# Patient Record
Sex: Female | Born: 1970 | Race: White | Hispanic: No | Marital: Married | Smoking: Never smoker
Health system: Southern US, Community
[De-identification: ages and names within clinical notes are randomized; demographics above are authoritative.]

## PROBLEM LIST (undated history)

## (undated) DIAGNOSIS — N200 Calculus of kidney: Secondary | ICD-10-CM

## (undated) DIAGNOSIS — E039 Hypothyroidism, unspecified: Secondary | ICD-10-CM

## (undated) DIAGNOSIS — K859 Acute pancreatitis without necrosis or infection, unspecified: Secondary | ICD-10-CM

## (undated) DIAGNOSIS — Z9889 Other specified postprocedural states: Secondary | ICD-10-CM

## (undated) DIAGNOSIS — R112 Nausea with vomiting, unspecified: Secondary | ICD-10-CM

## (undated) DIAGNOSIS — K219 Gastro-esophageal reflux disease without esophagitis: Secondary | ICD-10-CM

---

## 2001-03-15 HISTORY — PX: CHOLECYSTECTOMY: SHX55

## 2006-03-15 ENCOUNTER — Encounter: Admission: RE | Admit: 2006-03-15 | Discharge: 2006-03-15 | Payer: Self-pay | Admitting: Family Medicine

## 2008-01-22 ENCOUNTER — Ambulatory Visit (HOSPITAL_COMMUNITY): Admission: RE | Admit: 2008-01-22 | Discharge: 2008-01-22 | Payer: Self-pay | Admitting: General Surgery

## 2008-02-14 ENCOUNTER — Encounter: Admission: RE | Admit: 2008-02-14 | Discharge: 2008-02-14 | Payer: Self-pay | Admitting: General Surgery

## 2008-06-15 HISTORY — PX: LAPAROSCOPIC GASTRIC BANDING: SHX1100

## 2008-06-23 ENCOUNTER — Encounter: Admission: RE | Admit: 2008-06-23 | Discharge: 2008-07-21 | Payer: Self-pay | Admitting: General Surgery

## 2008-07-07 ENCOUNTER — Ambulatory Visit (HOSPITAL_COMMUNITY): Admission: RE | Admit: 2008-07-07 | Discharge: 2008-07-07 | Payer: Self-pay | Admitting: General Surgery

## 2009-03-03 ENCOUNTER — Encounter: Admission: RE | Admit: 2009-03-03 | Discharge: 2009-03-03 | Payer: Self-pay | Admitting: Family Medicine

## 2010-01-21 ENCOUNTER — Encounter: Admission: RE | Admit: 2010-01-21 | Discharge: 2010-01-21 | Payer: Self-pay | Admitting: Family Medicine

## 2010-03-11 ENCOUNTER — Encounter: Admission: RE | Admit: 2010-03-11 | Discharge: 2010-03-11 | Payer: Self-pay | Admitting: Family Medicine

## 2010-06-04 ENCOUNTER — Encounter: Payer: Self-pay | Admitting: Family Medicine

## 2010-06-05 ENCOUNTER — Encounter: Payer: Self-pay | Admitting: Family Medicine

## 2010-08-30 LAB — DIFFERENTIAL
Basophils Absolute: 0.1 10*3/uL (ref 0.0–0.1)
Eosinophils Absolute: 0.2 10*3/uL (ref 0.0–0.7)
Eosinophils Relative: 3 % (ref 0–5)
Lymphocytes Relative: 31 % (ref 12–46)
Lymphs Abs: 2.6 10*3/uL (ref 0.7–4.0)
Neutrophils Relative %: 58 % (ref 43–77)

## 2010-08-30 LAB — COMPREHENSIVE METABOLIC PANEL
ALT: 20 U/L (ref 0–35)
AST: 21 U/L (ref 0–37)
CO2: 27 mEq/L (ref 19–32)
Calcium: 9.1 mg/dL (ref 8.4–10.5)
Chloride: 108 mEq/L (ref 96–112)
Creatinine, Ser: 0.74 mg/dL (ref 0.4–1.2)
GFR calc non Af Amer: >60 mL/min (ref >60)
Glucose, Bld: 95 mg/dL (ref 70–99)
Total Bilirubin: 1 mg/dL (ref 0.3–1.2)

## 2010-08-30 LAB — CBC
Hemoglobin: 12.4 g/dL (ref 12.0–15.0)
MCHC: 32.8 g/dL (ref 30.0–36.0)
MCV: 85.5 fL (ref 78.0–100.0)
RBC: 4.44 MIL/uL (ref 3.87–5.11)
WBC: 8.3 10*3/uL (ref 4.0–10.5)

## 2010-08-30 LAB — PREGNANCY, URINE: Preg Test, Ur: NEGATIVE

## 2010-09-27 NOTE — Op Note (Signed)
NAMECHANTAY, WHITELOCK             ACCOUNT NO.:  [id number]   MEDICAL RECORD NO.:  [id number]          PATIENT TYPE:  OIB   LOCATION:  1529                         FACILITY:  [facility]   PHYSICIAN:  Sharlet Salina T. Hoxworth, M.D.DATE OF BIRTH:  04/11/1971   DATE OF PROCEDURE:  07/07/2008  DATE OF DISCHARGE:  07/07/2008                               OPERATIVE REPORT   PREOPERATIVE DIAGNOSIS:  Morbid obesity.   POSTOPERATIVE DIAGNOSIS:  Morbid obesity.   SURGICAL PROCEDURE:  Placement of laparoscopic adjustable gastric band.   SURGEON:  Dr. Johna Sheriff.   ASSISTANT:  Dr. Luretha Murphy.   ANESTHESIA:  General.   BRIEF HISTORY:  Ms. Ikner is a 40 year old female who presents with  progressive morbid obesity unresponsive to medical management.  She  presents with a BMI of 40.  Following extensive preoperative discussion  detailed elsewhere, we have elected to proceed with placement of  laparoscopic adjustable gastric band.  She is brought to the operating  room for this procedure.   DESCRIPTION OF OPERATION:  The patient was brought to the operating  room, placed in supine position on the operating table and general  endotracheal anesthesia was induced.  She had received preoperative  subcutaneous heparin and IV antibiotics.  PAS were in place.  The  abdomen was widely sterilely prepped and draped.  The correct patient  and procedure were verified.   The trocar sites were anesthetized prior to the incisions.  Abdominal  access was obtained with a 11 mm Optiview trocar in the left subcostal  space without difficulty and pneumoperitoneum established.  There was no  evidence of trocar injury on laparoscopy.  Under direct vision, a 15 mm  trocar was placed laterally in the right upper quadrant, a 12 mm trocar  more medially in the right upper quadrant and an 11 mm trocar above and  to the left of the umbilicus for the camera port.  Through a 5-mm  subxiphoid site, the [facility] retractor was  placed and the left lobe of  the liver elevated with excellent exposure of the upper stomach and  hiatus.  The patient had a questionable small hiatal hernia on upper GI  series, though no reflux symptoms.  An Ewald tube was inserted orally  into the stomach and the balloon inflated to 10 mL and pulled back  against the hiatus and there was no evidence of hernia.  The balloon was  deflated and the tube pulled back in the mid esophagus.  The peritoneum  at the angle of His was incised over the left crus and careful blunt  dissection carried back toward the retrogastric space.  The pars  flaccida was then opened along an avascular area and the crossing fat at  the base of the right crus was identified.  The peritoneum here was  incised and the finger dissector inserted into the retrogastric space  and passed easily up to the angle of His where it was deployed through.  An AP standard flush band system was introduced and the tubing passed in  the band passer and brought back around behind the stomach without  difficulty, and the band was brought back through the retrogastric  tunnel.  With the sizing tube back in the stomach, the band was snapped  into place and there was no undue tightness.  The sizing tube was  removed.  Holding the tubing toward the patient's feet, the fundus was  imbricated up over the band to the small gastric pouch with three  interrupted 2-0 Ethibond sutures.  The band appeared to be in excellent  position.  There was no evidence of bleeding or trocar injury.  The  tubing was brought out through the right mid abdominal trocar site and  then the [facility] retractor was removed under direct vision.  All CO2  evacuated and the trocars removed.  The incision for the port was  lengthened slightly and a subcutaneous pocket created.  A square of  Prolene mesh was sutured to the back of the port which was then attached  to the tubing and placed in a subcutaneous position.  The  tubing was  introduced smoothly into the abdomen.  The subcu at this site was closed  with running 3-0 Vicryl and the skin incisions were closed with  subcuticular Monocryl and Dermabond.  Sponge, needle and instrument  counts were correct.  The patient was taken to recovery in good  condition.      Lorne Skeens. Hoxworth, M.D.  Electronically Signed     BTH/MEDQ  D:  07/07/2008  T:  07/08/2008  Job:  [id number]

## 2011-02-01 ENCOUNTER — Encounter (INDEPENDENT_AMBULATORY_CARE_PROVIDER_SITE_OTHER): Payer: Self-pay

## 2011-02-03 ENCOUNTER — Ambulatory Visit (INDEPENDENT_AMBULATORY_CARE_PROVIDER_SITE_OTHER): Payer: BC Managed Care – PPO | Admitting: Physician Assistant

## 2011-02-03 ENCOUNTER — Encounter (INDEPENDENT_AMBULATORY_CARE_PROVIDER_SITE_OTHER): Payer: Self-pay

## 2011-02-03 VITALS — BP 106/82 | HR 60 | Temp 97.4°F | Resp 16 | Ht 68.0 in | Wt 177.4 lb

## 2011-02-03 DIAGNOSIS — Z9884 Bariatric surgery status: Secondary | ICD-10-CM

## 2011-02-03 DIAGNOSIS — Z87898 Personal history of other specified conditions: Secondary | ICD-10-CM

## 2011-02-03 DIAGNOSIS — Z8639 Personal history of other endocrine, nutritional and metabolic disease: Secondary | ICD-10-CM

## 2011-02-03 NOTE — Progress Notes (Signed)
  HISTORY: Danice Dippolito is a 40 y.o.female who received an AP-Standard lap-band in February 2010 by Dr. Johna Sheriff. She has been doing well since being seen about 18 months ago. Her only issue is nocturnal reflux which is alleviated by eating before 7 pm.  VITAL SIGNS: Filed Vitals:   02/03/11 1345  BP: 106/82  Pulse: 60  Temp: 97.4 F (36.3 C)  Resp: 16    PHYSICAL EXAM: Physical exam reveals a very well-appearing 40 y.o.female in no apparent distress Neurologic: Awake, alert, oriented Psych: Bright affect, conversant Respiratory: Breathing even and unlabored. No stridor or wheezing Extremities: Atraumatic, good range of motion. Skin: Warm, Dry, no rashes Musculoskeletal: Normal gait, Joints normal  ASSESMENT: 40 y.o.  female  s/p AP-Standard lap-band.   PLAN: Continue evening meals prior to 7 pm. Omeprazole or other PPI as needed. Return as needed.

## 2011-02-03 NOTE — Patient Instructions (Signed)
Follow up as needed

## 2011-03-28 ENCOUNTER — Other Ambulatory Visit: Payer: Self-pay | Admitting: Family Medicine

## 2011-03-28 DIAGNOSIS — Z1231 Encounter for screening mammogram for malignant neoplasm of breast: Secondary | ICD-10-CM

## 2011-04-20 ENCOUNTER — Ambulatory Visit: Payer: Self-pay

## 2011-05-02 ENCOUNTER — Ambulatory Visit
Admission: RE | Admit: 2011-05-02 | Discharge: 2011-05-02 | Disposition: A | Discharge status: Home / Self Care | Payer: BC Managed Care – PPO | Source: Ambulatory Visit | Attending: Family Medicine | Admitting: Family Medicine

## 2011-05-02 DIAGNOSIS — Z1231 Encounter for screening mammogram for malignant neoplasm of breast: Secondary | ICD-10-CM

## 2012-05-02 ENCOUNTER — Other Ambulatory Visit: Payer: Self-pay | Admitting: Family Medicine

## 2012-05-02 ENCOUNTER — Encounter (INDEPENDENT_AMBULATORY_CARE_PROVIDER_SITE_OTHER): Payer: Self-pay | Admitting: Physician Assistant

## 2012-05-02 ENCOUNTER — Ambulatory Visit (INDEPENDENT_AMBULATORY_CARE_PROVIDER_SITE_OTHER): Payer: BC Managed Care – PPO | Admitting: Physician Assistant

## 2012-05-02 VITALS — BP 124/68 | HR 72 | Temp 98.3°F | Resp 16 | Ht 68.0 in | Wt 177.2 lb

## 2012-05-02 DIAGNOSIS — Z4651 Encounter for fitting and adjustment of gastric lap band: Secondary | ICD-10-CM

## 2012-05-02 DIAGNOSIS — Z1231 Encounter for screening mammogram for malignant neoplasm of breast: Secondary | ICD-10-CM

## 2012-05-02 DIAGNOSIS — K219 Gastro-esophageal reflux disease without esophagitis: Secondary | ICD-10-CM

## 2012-05-02 MED ORDER — PANTOPRAZOLE SODIUM 20 MG PO TBEC: 20.0000 mg | DELAYED_RELEASE_TABLET | Freq: Every day | ORAL | Status: DC

## 2012-05-02 NOTE — Patient Instructions (Signed)
Return in two months. Focus on good food choices as well as physical activity. Return sooner if you have an increase in hunger, portion sizes or weight. Return also for difficulty swallowing, night cough, reflux.   

## 2012-05-02 NOTE — Progress Notes (Signed)
  HISTORY: [name] is a 41 y.o.female who received an AP-Standard lap-band in February 2010 by Dr. Johna Sheriff. She was last seen in September 2012. Since then she's had GERD symptoms but they've worsened. She's also having increasing solid food dysphagia. She says she's tried OTC H2 blockers and omeprazole without much success. She believes that she needs some fluid removed.  VITAL SIGNS: Filed Vitals:   05/02/12 1554  BP: 124/68  Pulse: 72  Temp: 98.3 F (36.8 C)  Resp: 16    PHYSICAL EXAM: Physical exam reveals a very well-appearing 41 y.o.female in no apparent distress Neurologic: Awake, alert, oriented Psych: Bright affect, conversant Respiratory: Breathing even and unlabored. No stridor or wheezing Abdomen: Soft, nontender, nondistended to palpation. Incisions well-healed. No incisional hernias. Port easily palpated. Extremities: Atraumatic, good range of motion.  ASSESMENT: 41 y.o.  female  s/p AP-Standard lap-band.   PLAN: The patient's port was accessed with a 20G Huber needle without difficulty. Clear fluid was aspirated and 0.5 mL saline was removed from the port to give 5.75 mL. She was able to swallow water without difficulty following this. I've prescribed Protonix daily (a 30 day Rx and a 90 day Rx for mail order pharmacy purposes). We'll have her back in a couple months or sooner if needed.

## 2012-05-23 ENCOUNTER — Ambulatory Visit
Admission: RE | Admit: 2012-05-23 | Discharge: 2012-05-23 | Disposition: A | Discharge status: Home / Self Care | Payer: BC Managed Care – PPO | Source: Ambulatory Visit | Attending: Family Medicine | Admitting: Family Medicine

## 2012-05-23 DIAGNOSIS — Z1231 Encounter for screening mammogram for malignant neoplasm of breast: Secondary | ICD-10-CM

## 2012-07-04 ENCOUNTER — Encounter (INDEPENDENT_AMBULATORY_CARE_PROVIDER_SITE_OTHER): Payer: Self-pay

## 2012-07-04 ENCOUNTER — Ambulatory Visit (INDEPENDENT_AMBULATORY_CARE_PROVIDER_SITE_OTHER): Payer: BC Managed Care – PPO | Admitting: Physician Assistant

## 2012-07-04 VITALS — BP 108/82 | HR 80 | Temp 98.3°F | Resp 18 | Ht 68.0 in | Wt 195.6 lb

## 2012-07-04 DIAGNOSIS — Z4651 Encounter for fitting and adjustment of gastric lap band: Secondary | ICD-10-CM

## 2012-07-04 NOTE — Progress Notes (Signed)
  HISTORY: [name] is a 42 y.o.female who received an AP-Standard lap-band in February 2010 by Dr. Johna Sheriff. She comes in with 18 lbs weight gain since removal of 0.5 mL fluid 2 months ago for GERD. Her reflux symptoms have completely resolved but her hunger is significant. She would like a fill today to get her weight gain under better control.  VITAL SIGNS: Filed Vitals:   07/04/12 1035  BP: 108/82  Pulse: 80  Temp: 98.3 F (36.8 C)  Resp: 18    PHYSICAL EXAM: Physical exam reveals a very well-appearing 42 y.o.female in no apparent distress Neurologic: Awake, alert, oriented Psych: Bright affect, conversant Respiratory: Breathing even and unlabored. No stridor or wheezing Abdomen: Soft, nontender, nondistended to palpation. Incisions well-healed. No incisional hernias. Port easily palpated. Extremities: Atraumatic, good range of motion.  ASSESMENT: 42 y.o.  female  s/p AP-Standard lap-band.   PLAN: The patient's port was accessed with a 20G Huber needle without difficulty. Clear fluid was aspirated and 0.25 mL saline was added to the port to give a total predicted volume of 6 mL. The patient was able to swallow water without difficulty following the procedure and was instructed to take clear liquids for the next 24-48 hours and advance slowly as tolerated.

## 2012-07-04 NOTE — Patient Instructions (Signed)
Take clear liquids tonight. Thin protein shakes are ok to start tomorrow morning. Slowly advance your diet thereafter. Call us if you have persistent vomiting or regurgitation, night cough or reflux symptoms. Return as scheduled or sooner if you notice no changes in hunger/portion sizes.  

## 2012-10-24 ENCOUNTER — Ambulatory Visit (INDEPENDENT_AMBULATORY_CARE_PROVIDER_SITE_OTHER): Payer: BC Managed Care – PPO | Admitting: Physician Assistant

## 2012-10-24 ENCOUNTER — Encounter (INDEPENDENT_AMBULATORY_CARE_PROVIDER_SITE_OTHER): Payer: Self-pay

## 2012-10-24 VITALS — BP 122/80 | HR 78 | Temp 97.8°F | Resp 14 | Ht 68.0 in | Wt 197.4 lb

## 2012-10-24 DIAGNOSIS — Z4651 Encounter for fitting and adjustment of gastric lap band: Secondary | ICD-10-CM

## 2012-10-24 NOTE — Patient Instructions (Signed)
Take clear liquids tonight. Thin protein shakes are ok to start tomorrow morning. Slowly advance your diet thereafter. Call us if you have persistent vomiting or regurgitation, night cough or reflux symptoms. Return as scheduled or sooner if you notice no changes in hunger/portion sizes.  

## 2012-10-24 NOTE — Progress Notes (Signed)
  HISTORY: [name] is a 42 y.o.female who received an AP-Standard lap-band in February 2010 by Dr. Johna Sheriff. She comes in with little change in weight since her last visit but she does complain of gradually increasing hunger and portion sizes. She denies regurgitation or reflux. She would like a fill today to help keep her hunger under control as well as to reduce her weight.  VITAL SIGNS: Filed Vitals:   10/24/12 0910  BP: 122/80  Pulse: 78  Temp: 97.8 F (36.6 C)  Resp: 14    PHYSICAL EXAM: Physical exam reveals a very well-appearing 42 y.o.female in no apparent distress Neurologic: Awake, alert, oriented Psych: Bright affect, conversant Respiratory: Breathing even and unlabored. No stridor or wheezing Abdomen: Soft, nontender, nondistended to palpation. Incisions well-healed. No incisional hernias. Port easily palpated. Extremities: Atraumatic, good range of motion.  ASSESMENT: 42 y.o.  female  s/p AP-Standard lap-band.   PLAN: The patient's port was accessed with a 20G Huber needle without difficulty. Clear fluid was aspirated and 0.25 mL saline was added to the port to give a total predicted volume of 6.25 mL. The patient was able to swallow water without difficulty following the procedure and was instructed to take clear liquids for the next 24-48 hours and advance slowly as tolerated. This is her highest fill volume thus far. She had this volume in the past but it required removal eventually due to GERD. Hopefully this will give her the appropriate amount of restriction without dysphagia or reflux.

## 2013-01-28 ENCOUNTER — Other Ambulatory Visit: Payer: Self-pay | Admitting: Orthopedic Surgery

## 2013-02-05 ENCOUNTER — Encounter (HOSPITAL_COMMUNITY): Payer: Self-pay | Admitting: Pharmacy Technician

## 2013-02-06 ENCOUNTER — Encounter (HOSPITAL_COMMUNITY)
Admission: RE | Admit: 2013-02-06 | Discharge: 2013-02-06 | Disposition: A | Discharge status: Home / Self Care | Payer: BC Managed Care – PPO | Source: Ambulatory Visit | Attending: Orthopedic Surgery | Admitting: Orthopedic Surgery

## 2013-02-06 ENCOUNTER — Encounter (HOSPITAL_COMMUNITY): Payer: Self-pay

## 2013-02-06 DIAGNOSIS — Z01818 Encounter for other preprocedural examination: Secondary | ICD-10-CM | POA: Insufficient documentation

## 2013-02-06 DIAGNOSIS — Z01812 Encounter for preprocedural laboratory examination: Secondary | ICD-10-CM | POA: Insufficient documentation

## 2013-02-06 DIAGNOSIS — Z01810 Encounter for preprocedural cardiovascular examination: Secondary | ICD-10-CM | POA: Insufficient documentation

## 2013-02-06 HISTORY — DX: Other specified postprocedural states: R11.2

## 2013-02-06 HISTORY — DX: Calculus of kidney: N20.0

## 2013-02-06 HISTORY — DX: Other specified postprocedural states: Z98.890

## 2013-02-06 HISTORY — DX: Acute pancreatitis without necrosis or infection, unspecified: K85.90

## 2013-02-06 HISTORY — DX: Gastro-esophageal reflux disease without esophagitis: K21.9

## 2013-02-06 HISTORY — DX: Hypothyroidism, unspecified: E03.9

## 2013-02-06 LAB — ABO/RH: ABO/RH(D): O POS

## 2013-02-06 LAB — TYPE AND SCREEN: Antibody Screen: NEGATIVE

## 2013-02-06 LAB — URINALYSIS, ROUTINE W REFLEX MICROSCOPIC
Hgb urine dipstick: NEGATIVE
Nitrite: NEGATIVE
Protein, ur: NEGATIVE mg/dL
Specific Gravity, Urine: 1.018 (ref 1.005–1.030)
Urobilinogen, UA: 0.2 mg/dL (ref 0.0–1.0)
pH: 6 (ref 5.0–8.0)

## 2013-02-06 LAB — CBC WITH DIFFERENTIAL/PLATELET
Basophils Absolute: 0.1 10*3/uL (ref 0.0–0.1)
Eosinophils Absolute: 0.2 10*3/uL (ref 0.0–0.7)
HCT: 35 % — ABNORMAL LOW (ref 36.0–46.0)
Hemoglobin: 11.8 g/dL — ABNORMAL LOW (ref 12.0–15.0)
Lymphocytes Relative: 48 % — ABNORMAL HIGH (ref 12–46)
Lymphs Abs: 3 10*3/uL (ref 0.7–4.0)
MCHC: 33.7 g/dL (ref 30.0–36.0)
MCV: 86.4 fL (ref 78.0–100.0)
Neutro Abs: 2.6 10*3/uL (ref 1.7–7.7)
WBC: 6.2 10*3/uL (ref 4.0–10.5)

## 2013-02-06 LAB — COMPREHENSIVE METABOLIC PANEL
ALT: 9 U/L (ref 0–35)
AST: 18 U/L (ref 0–37)
Alkaline Phosphatase: 76 U/L (ref 39–117)
GFR calc Af Amer: >90 mL/min (ref >90)
Glucose, Bld: 83 mg/dL (ref 70–99)
Potassium: 4.1 mEq/L (ref 3.5–5.1)
Sodium: 138 mEq/L (ref 135–145)
Total Bilirubin: 0.5 mg/dL (ref 0.3–1.2)
Total Protein: 6.9 g/dL (ref 6.0–8.3)

## 2013-02-06 LAB — APTT: aPTT: 34 seconds (ref 24–37)

## 2013-02-06 LAB — SURGICAL PCR SCREEN
MRSA, PCR: NEGATIVE
Staphylococcus aureus: NEGATIVE

## 2013-02-06 LAB — URINE MICROSCOPIC-ADD ON

## 2013-02-06 NOTE — Progress Notes (Signed)
PCP is Dr. Delorse Lek  Denies seeing a Cardiologist.  Denies having a recent EKG, CXR  Denies having a stress test, Echo, or card cath.

## 2013-02-06 NOTE — Pre-Procedure Instructions (Signed)
Devyne Hauger  02/06/2013   Your procedure is scheduled on:  Oct 1 @ 0830  Report to [facility]  2 * 3 at [phone].  Call this number if you have problems the morning of surgery: [phone]   Remember:   Do not eat food or drink liquids after midnight.   Take these medicines the morning of surgery with A SIP OF WATER: Hydrocodone-acetaminophen if needed  Stop taking Aspirin, Aleve, Ibuprofen, BC'S, Goody's, Fish Oil, and Herbal medications.   Do not wear jewelry, make-up or nail polish.  Do not wear lotions, powders, or perfumes. You may wear deodorant.  Do not shave 48 hours prior to surgery. Men may shave face and neck.  Do not bring valuables to the hospital.  [facility] is not responsible                  for any belongings or valuables.               Contacts, dentures or bridgework may not be worn into surgery.  Leave suitcase in the car. After surgery it may be brought to your room.  For patients admitted to the hospital, discharge time is determined by your                treatment team.               Patients discharged the day of surgery will not be allowed to drive  home.    Special Instructions: Shower using CHG 2 nights before surgery and the night before surgery.  If you shower the day of surgery use CHG.  Use special wash - you have one bottle of CHG for all showers.  You should use approximately 1/3 of the bottle for each shower.   Please read over the following fact sheets that you were given: Pain Booklet, Coughing and Deep Breathing, Blood Transfusion Information, MRSA Information and Surgical Site Infection Prevention

## 2013-02-08 LAB — URINE CULTURE

## 2013-02-11 MED ORDER — CEFAZOLIN SODIUM-DEXTROSE 2-3 GM-% IV SOLR
2.0000 g | INTRAVENOUS | Status: AC
  Administered 2013-02-12: 2 g via INTRAVENOUS
  Filled 2013-02-11: qty 50

## 2013-02-12 ENCOUNTER — Ambulatory Visit (HOSPITAL_COMMUNITY): Payer: BC Managed Care – PPO | Admitting: Anesthesiology

## 2013-02-12 ENCOUNTER — Encounter (HOSPITAL_COMMUNITY): Payer: Self-pay | Admitting: Anesthesiology

## 2013-02-12 ENCOUNTER — Encounter (HOSPITAL_COMMUNITY)
Admission: RE | Disposition: A | Discharge status: Home / Self Care | Payer: Self-pay | Source: Ambulatory Visit | Attending: Orthopedic Surgery

## 2013-02-12 ENCOUNTER — Inpatient Hospital Stay (HOSPITAL_COMMUNITY)
Admission: RE | Admit: 2013-02-12 | Discharge: 2013-02-13 | DRG: 865 | Disposition: A | Discharge status: Home / Self Care | Payer: BC Managed Care – PPO | Source: Ambulatory Visit | Attending: Orthopedic Surgery | Admitting: Orthopedic Surgery

## 2013-02-12 ENCOUNTER — Ambulatory Visit (HOSPITAL_COMMUNITY): Payer: BC Managed Care – PPO

## 2013-02-12 DIAGNOSIS — M5020 Other cervical disc displacement, unspecified cervical region: Principal | ICD-10-CM | POA: Diagnosis present

## 2013-02-12 DIAGNOSIS — K219 Gastro-esophageal reflux disease without esophagitis: Secondary | ICD-10-CM | POA: Diagnosis present

## 2013-02-12 DIAGNOSIS — E039 Hypothyroidism, unspecified: Secondary | ICD-10-CM | POA: Diagnosis present

## 2013-02-12 DIAGNOSIS — Z9089 Acquired absence of other organs: Secondary | ICD-10-CM

## 2013-02-12 DIAGNOSIS — M5010 Cervical disc disorder with radiculopathy, unspecified cervical region: Secondary | ICD-10-CM

## 2013-02-12 DIAGNOSIS — Z881 Allergy status to other antibiotic agents status: Secondary | ICD-10-CM

## 2013-02-12 HISTORY — PX: ANTERIOR CERVICAL DECOMP/DISCECTOMY FUSION: SHX1161

## 2013-02-12 SURGERY — ANTERIOR CERVICAL DECOMPRESSION/DISCECTOMY FUSION 1 LEVEL
Anesthesia: General | Site: Neck | Wound class: Clean

## 2013-02-12 MED ORDER — METOCLOPRAMIDE HCL 5 MG/ML IJ SOLN
10.0000 mg | Freq: Once | INTRAMUSCULAR | Status: AC | PRN
  Administered 2013-02-12: 10 mg via INTRAVENOUS

## 2013-02-12 MED ORDER — 0.9 % SODIUM CHLORIDE (POUR BTL) OPTIME
TOPICAL | Status: DC | PRN
  Administered 2013-02-12: 1000 mL

## 2013-02-12 MED ORDER — ONDANSETRON HCL 4 MG/2ML IJ SOLN
4.0000 mg | Freq: Once | INTRAMUSCULAR | Status: AC
  Administered 2013-02-12: 4 mg via INTRAVENOUS

## 2013-02-12 MED ORDER — METOCLOPRAMIDE HCL 5 MG/ML IJ SOLN
INTRAMUSCULAR | Status: AC
  Filled 2013-02-12: qty 2

## 2013-02-12 MED ORDER — PHENOL 1.4 % MT LIQD
1.0000 | OROMUCOSAL | Status: DC | PRN
  Filled 2013-02-12: qty 177

## 2013-02-12 MED ORDER — ONDANSETRON HCL 4 MG/2ML IJ SOLN: 4.0000 mg | INTRAMUSCULAR | Status: DC | PRN

## 2013-02-12 MED ORDER — SUFENTANIL CITRATE 50 MCG/ML IV SOLN
INTRAVENOUS | Status: DC | PRN
  Administered 2013-02-12 (×3): 10 ug via INTRAVENOUS

## 2013-02-12 MED ORDER — ROCURONIUM BROMIDE 100 MG/10ML IV SOLN
INTRAVENOUS | Status: DC | PRN
  Administered 2013-02-12: 50 mg via INTRAVENOUS

## 2013-02-12 MED ORDER — THROMBIN 20000 UNITS EX SOLR
CUTANEOUS | Status: AC
  Filled 2013-02-12: qty 20000

## 2013-02-12 MED ORDER — MENTHOL 3 MG MT LOZG
LOZENGE | OROMUCOSAL | Status: AC
  Administered 2013-02-12: 1 via ORAL
  Filled 2013-02-12: qty 9

## 2013-02-12 MED ORDER — BUPIVACAINE-EPINEPHRINE 0.25% -1:200000 IJ SOLN
INTRAMUSCULAR | Status: DC | PRN
  Administered 2013-02-12: 2 mL

## 2013-02-12 MED ORDER — PROPOFOL 10 MG/ML IV BOLUS
INTRAVENOUS | Status: DC | PRN
  Administered 2013-02-12: 200 mg via INTRAVENOUS

## 2013-02-12 MED ORDER — DEXAMETHASONE SODIUM PHOSPHATE 10 MG/ML IJ SOLN
INTRAMUSCULAR | Status: DC | PRN
  Administered 2013-02-12: 8 mg via INTRAVENOUS

## 2013-02-12 MED ORDER — ZOLPIDEM TARTRATE 5 MG PO TABS: 5.0000 mg | ORAL_TABLET | Freq: Every evening | ORAL | Status: DC | PRN

## 2013-02-12 MED ORDER — HYDROMORPHONE HCL PF 1 MG/ML IJ SOLN: 0.2500 mg | INTRAMUSCULAR | Status: DC | PRN

## 2013-02-12 MED ORDER — ACETAMINOPHEN 650 MG RE SUPP: 650.0000 mg | RECTAL | Status: DC | PRN

## 2013-02-12 MED ORDER — THROMBIN 20000 UNITS EX SOLR
CUTANEOUS | Status: DC | PRN
  Administered 2013-02-12: 09:00:00 via TOPICAL

## 2013-02-12 MED ORDER — HYDROCODONE-ACETAMINOPHEN 5-325 MG PO TABS
1.0000 | ORAL_TABLET | ORAL | Status: DC | PRN
  Administered 2013-02-12: 2 via ORAL
  Filled 2013-02-12: qty 2

## 2013-02-12 MED ORDER — SODIUM CHLORIDE 0.9 % IJ SOLN
3.0000 mL | Freq: Two times a day (BID) | INTRAMUSCULAR | Status: DC
  Administered 2013-02-12: 3 mL via INTRAVENOUS

## 2013-02-12 MED ORDER — CEFAZOLIN SODIUM 1-5 GM-% IV SOLN
1.0000 g | Freq: Three times a day (TID) | INTRAVENOUS | Status: AC
Start: 2013-02-12 — End: 2013-02-12
  Administered 2013-02-12 (×2): 1 g via INTRAVENOUS
  Filled 2013-02-12 (×2): qty 50

## 2013-02-12 MED ORDER — SODIUM CHLORIDE 0.9 % IV SOLN: 250.0000 mL | INTRAVENOUS | Status: DC

## 2013-02-12 MED ORDER — LIDOCAINE HCL (CARDIAC) 20 MG/ML IV SOLN
INTRAVENOUS | Status: DC | PRN
  Administered 2013-02-12: 100 mg via INTRAVENOUS

## 2013-02-12 MED ORDER — GLYCOPYRROLATE 0.2 MG/ML IJ SOLN
INTRAMUSCULAR | Status: DC | PRN
  Administered 2013-02-12: 0.4 mg via INTRAVENOUS

## 2013-02-12 MED ORDER — DIAZEPAM 5 MG PO TABS
5.0000 mg | ORAL_TABLET | Freq: Four times a day (QID) | ORAL | Status: DC | PRN
  Administered 2013-02-12: 5 mg via ORAL
  Filled 2013-02-12: qty 1

## 2013-02-12 MED ORDER — SODIUM CHLORIDE 0.9 % IJ SOLN: 3.0000 mL | INTRAMUSCULAR | Status: DC | PRN

## 2013-02-12 MED ORDER — ACETAMINOPHEN 325 MG PO TABS: 650.0000 mg | ORAL_TABLET | ORAL | Status: DC | PRN

## 2013-02-12 MED ORDER — BUPIVACAINE-EPINEPHRINE PF 0.25-1:200000 % IJ SOLN
INTRAMUSCULAR | Status: AC
  Filled 2013-02-12: qty 30

## 2013-02-12 MED ORDER — MIDAZOLAM HCL 5 MG/5ML IJ SOLN
INTRAMUSCULAR | Status: DC | PRN
  Administered 2013-02-12: 2 mg via INTRAVENOUS

## 2013-02-12 MED ORDER — NEOSTIGMINE METHYLSULFATE 1 MG/ML IJ SOLN
INTRAMUSCULAR | Status: DC | PRN
  Administered 2013-02-12: 3 mg via INTRAVENOUS

## 2013-02-12 MED ORDER — MORPHINE SULFATE 2 MG/ML IJ SOLN: 1.0000 mg | INTRAMUSCULAR | Status: DC | PRN

## 2013-02-12 MED ORDER — PHENYLEPHRINE HCL 10 MG/ML IJ SOLN
10.0000 mg | INTRAVENOUS | Status: DC | PRN
  Administered 2013-02-12: 20 ug/min via INTRAVENOUS

## 2013-02-12 MED ORDER — ONDANSETRON HCL 4 MG/2ML IJ SOLN
INTRAMUSCULAR | Status: DC | PRN
  Administered 2013-02-12: 4 mg via INTRAVENOUS

## 2013-02-12 MED ORDER — OXYCODONE HCL 5 MG/5ML PO SOLN: 5.0000 mg | Freq: Once | ORAL | Status: DC | PRN

## 2013-02-12 MED ORDER — ALUM & MAG HYDROXIDE-SIMETH 200-200-20 MG/5ML PO SUSP: 30.0000 mL | Freq: Four times a day (QID) | ORAL | Status: DC | PRN

## 2013-02-12 MED ORDER — MENTHOL 3 MG MT LOZG
1.0000 | LOZENGE | OROMUCOSAL | Status: DC | PRN
  Administered 2013-02-12: 1 via ORAL
  Filled 2013-02-12: qty 9

## 2013-02-12 MED ORDER — VECURONIUM BROMIDE 10 MG IV SOLR
INTRAVENOUS | Status: DC | PRN
  Administered 2013-02-12: 2 mg via INTRAVENOUS

## 2013-02-12 MED ORDER — OXYCODONE HCL 5 MG PO TABS: 5.0000 mg | ORAL_TABLET | Freq: Once | ORAL | Status: DC | PRN

## 2013-02-12 MED ORDER — METOCLOPRAMIDE HCL 5 MG/ML IJ SOLN
INTRAMUSCULAR | Status: DC | PRN
  Administered 2013-02-12: 10 mg via INTRAVENOUS

## 2013-02-12 MED ORDER — POVIDONE-IODINE 7.5 % EX SOLN
Freq: Once | CUTANEOUS | Status: DC
  Filled 2013-02-12: qty 118

## 2013-02-12 MED ORDER — ONDANSETRON HCL 4 MG/2ML IJ SOLN
INTRAMUSCULAR | Status: AC
  Administered 2013-02-12: 4 mg via INTRAVENOUS
  Filled 2013-02-12: qty 2

## 2013-02-12 MED ORDER — PHENYLEPHRINE HCL 10 MG/ML IJ SOLN
INTRAMUSCULAR | Status: DC | PRN
  Administered 2013-02-12 (×6): 80 ug via INTRAVENOUS
  Administered 2013-02-12: 120 ug via INTRAVENOUS
  Administered 2013-02-12: 80 ug via INTRAVENOUS

## 2013-02-12 MED ORDER — OXYCODONE-ACETAMINOPHEN 5-325 MG PO TABS: 1.0000 | ORAL_TABLET | ORAL | Status: DC | PRN

## 2013-02-12 MED ORDER — LACTATED RINGERS IV SOLN
INTRAVENOUS | Status: DC | PRN
  Administered 2013-02-12 (×2): via INTRAVENOUS

## 2013-02-12 SURGICAL SUPPLY — 77 items
APL SKNCLS STERI-STRIP NONHPOA (GAUZE/BANDAGES/DRESSINGS) ×1
BENZOIN TINCTURE PRP APPL 2/3 (GAUZE/BANDAGES/DRESSINGS) ×2 IMPLANT
BIT DRILL NEURO 2X3.1 SFT TUCH (MISCELLANEOUS) ×1 IMPLANT
BIT DRILL SRG 14X2.2XFLT CHK (BIT) IMPLANT
BIT DRL SRG 14X2.2XFLT CHK (BIT) ×1
BLADE SURG 15 STRL LF DISP TIS (BLADE) ×1 IMPLANT
BLADE SURG 15 STRL SS (BLADE) ×2
BLADE SURG ROTATE 9660 (MISCELLANEOUS) ×1 IMPLANT
BUR MATCHSTICK NEURO 3.0 LAGG (BURR) IMPLANT
CARTRIDGE OIL MAESTRO DRILL (MISCELLANEOUS) ×1 IMPLANT
CLOTH BEACON ORANGE TIMEOUT ST (SAFETY) ×2 IMPLANT
CLSR STERI-STRIP ANTIMIC 1/2X4 (GAUZE/BANDAGES/DRESSINGS) ×1 IMPLANT
CORDS BIPOLAR (ELECTRODE) ×2 IMPLANT
COVER SURGICAL LIGHT HANDLE (MISCELLANEOUS) ×2 IMPLANT
CRADLE DONUT ADULT HEAD (MISCELLANEOUS) ×2 IMPLANT
DEVICE ENDSKLTN MED 6 7MM (Orthopedic Implant) IMPLANT
DIFFUSER DRILL AIR PNEUMATIC (MISCELLANEOUS) ×2 IMPLANT
DRAIN JACKSON RD 7FR 3/32 (WOUND CARE) IMPLANT
DRAPE C-ARM 42X72 X-RAY (DRAPES) ×2 IMPLANT
DRAPE POUCH INSTRU U-SHP 10X18 (DRAPES) ×2 IMPLANT
DRAPE SURG 17X23 STRL (DRAPES) ×6 IMPLANT
DRILL BIT SKYLINE 14MM (BIT) ×2
DRILL NEURO 2X3.1 SOFT TOUCH (MISCELLANEOUS) ×2
DURAPREP 26ML APPLICATOR (WOUND CARE) ×2 IMPLANT
ELECT COATED BLADE 2.86 ST (ELECTRODE) ×2 IMPLANT
ELECT REM PT RETURN 9FT ADLT (ELECTROSURGICAL) ×2
ELECTRODE REM PT RTRN 9FT ADLT (ELECTROSURGICAL) ×1 IMPLANT
ENDOSKELETON MED 6 7MM (Orthopedic Implant) ×2 IMPLANT
EVACUATOR SILICONE 100CC (DRAIN) IMPLANT
GAUZE SPONGE 4X4 16PLY XRAY LF (GAUZE/BANDAGES/DRESSINGS) ×2 IMPLANT
GLOVE BIO SURGEON STRL SZ7 (GLOVE) ×2 IMPLANT
GLOVE BIO SURGEON STRL SZ8 (GLOVE) ×2 IMPLANT
GLOVE BIOGEL PI IND STRL 7.0 (GLOVE) ×2 IMPLANT
GLOVE BIOGEL PI IND STRL 8 (GLOVE) ×1 IMPLANT
GLOVE BIOGEL PI INDICATOR 7.0 (GLOVE) ×2
GLOVE BIOGEL PI INDICATOR 8 (GLOVE) ×1
GLOVE BIOGEL PI ORTHO PRO 7.5 (GLOVE) ×1
GLOVE PI ORTHO PRO STRL 7.5 (GLOVE) IMPLANT
GLOVE SURG SS PI 7.5 STRL IVOR (GLOVE) ×1 IMPLANT
GOWN SRG XL XLNG 56XLVL 4 (GOWN DISPOSABLE) ×1 IMPLANT
GOWN STRL NON-REIN LRG LVL3 (GOWN DISPOSABLE) ×2 IMPLANT
GOWN STRL NON-REIN XL XLG LVL4 (GOWN DISPOSABLE) ×4
IV CATH 14GX2 1/4 (CATHETERS) ×2 IMPLANT
KIT BASIN OR (CUSTOM PROCEDURE TRAY) ×2 IMPLANT
KIT ROOM TURNOVER OR (KITS) ×2 IMPLANT
MANIFOLD NEPTUNE II (INSTRUMENTS) ×2 IMPLANT
NDL SPNL 20GX3.5 QUINCKE YW (NEEDLE) ×1 IMPLANT
NEEDLE 27GAX1X1/2 (NEEDLE) ×2 IMPLANT
NEEDLE SPNL 20GX3.5 QUINCKE YW (NEEDLE) ×2 IMPLANT
NS IRRIG 1000ML POUR BTL (IV SOLUTION) ×2 IMPLANT
OIL CARTRIDGE MAESTRO DRILL (MISCELLANEOUS) ×2
PACK ORTHO CERVICAL (CUSTOM PROCEDURE TRAY) ×2 IMPLANT
PAD ARMBOARD 7.5X6 YLW CONV (MISCELLANEOUS) ×4 IMPLANT
PATTIES SURGICAL .5 X.5 (GAUZE/BANDAGES/DRESSINGS) IMPLANT
PATTIES SURGICAL .5 X1 (DISPOSABLE) ×2 IMPLANT
PIN DISTRACTION 14 (PIN) ×1 IMPLANT
PLATE SKYLINE 12MM (Plate) ×1 IMPLANT
PUTTY BONE DBX 2.5 MIS (Bone Implant) ×1 IMPLANT
SCREW VAR SELF TAP SKYLINE 14M (Screw) ×4 IMPLANT
SPONGE GAUZE 4X4 12PLY (GAUZE/BANDAGES/DRESSINGS) ×2 IMPLANT
SPONGE INTESTINAL PEANUT (DISPOSABLE) ×2 IMPLANT
SPONGE SURGIFOAM ABS GEL 100 (HEMOSTASIS) ×1 IMPLANT
STRIP CLOSURE SKIN 1/2X4 (GAUZE/BANDAGES/DRESSINGS) ×2 IMPLANT
SURGIFLO TRUKIT (HEMOSTASIS) IMPLANT
SUT MNCRL AB 4-0 PS2 18 (SUTURE) ×1 IMPLANT
SUT SILK 4 0 (SUTURE) ×2
SUT SILK 4-0 18XBRD TIE 12 (SUTURE) IMPLANT
SUT VIC AB 2-0 CT2 18 VCP726D (SUTURE) ×2 IMPLANT
SYR BULB IRRIGATION 50ML (SYRINGE) ×2 IMPLANT
SYR CONTROL 10ML LL (SYRINGE) ×4 IMPLANT
TAPE CLOTH 4X10 WHT NS (GAUZE/BANDAGES/DRESSINGS) ×1 IMPLANT
TAPE CLOTH SURG 4X10 WHT LF (GAUZE/BANDAGES/DRESSINGS) ×1 IMPLANT
TAPE UMBILICAL COTTON 1/8X30 (MISCELLANEOUS) ×2 IMPLANT
TOWEL OR 17X24 6PK STRL BLUE (TOWEL DISPOSABLE) ×2 IMPLANT
TOWEL OR 17X26 10 PK STRL BLUE (TOWEL DISPOSABLE) ×2 IMPLANT
WATER STERILE IRR 1000ML POUR (IV SOLUTION) IMPLANT
YANKAUER SUCT BULB TIP NO VENT (SUCTIONS) ×2 IMPLANT

## 2013-02-12 NOTE — Transfer of Care (Signed)
Immediate Anesthesia Transfer of Care Note  Patient: [name]  Procedure(s) Performed: Procedure(s) with comments: ANTERIOR CERVICAL DECOMPRESSION/DISCECTOMY FUSION 1 LEVEL (N/A) - Anterior cervical decompression and fusion, cervical 6-7 with instrumentation and allograft.   Patient Location: PACU  Anesthesia Type:General  Level of Consciousness: awake, alert , oriented and patient cooperative  Airway & Oxygen Therapy: Patient Spontanous Breathing and Patient connected to nasal cannula oxygen  Post-op Assessment: Report given to PACU RN and Post -op Vital signs reviewed and stable  Post vital signs: Reviewed  Complications: No apparent anesthesia complications

## 2013-02-12 NOTE — Anesthesia Preprocedure Evaluation (Signed)
Anesthesia Evaluation  Patient identified by MRN, date of birth, ID band Patient awake    Reviewed: Allergy & Precautions, H&P , NPO status , Patient's Chart, lab work & pertinent test results, reviewed documented beta blocker date and time   History of Anesthesia Complications (+) PONV  Airway Mallampati: II TM Distance: >3 FB Neck ROM: full    Dental   Pulmonary neg pulmonary ROS,  breath sounds clear to auscultation        Cardiovascular negative cardio ROS  Rhythm:regular     Neuro/Psych negative neurological ROS  negative psych ROS   GI/Hepatic Neg liver ROS, GERD-  Medicated and Controlled,  Endo/Other  negative endocrine ROSHypothyroidism   Renal/GU Renal disease  negative genitourinary   Musculoskeletal   Abdominal   Peds  Hematology negative hematology ROS (+)   Anesthesia Other Findings See surgeon's H&P   Reproductive/Obstetrics negative OB ROS                           Anesthesia Physical Anesthesia Plan  ASA: II  Anesthesia Plan: General   Post-op Pain Management:    Induction: Intravenous  Airway Management Planned: Oral ETT  Additional Equipment:   Intra-op Plan:   Post-operative Plan: Extubation in OR  Informed Consent: I have reviewed the patients History and Physical, chart, labs and discussed the procedure including the risks, benefits and alternatives for the proposed anesthesia with the patient or authorized representative who has indicated his/her understanding and acceptance.   Dental Advisory Given  Plan Discussed with: CRNA and Surgeon  Anesthesia Plan Comments:         Anesthesia Quick Evaluation

## 2013-02-12 NOTE — Preoperative (Signed)
Beta Blockers   Reason not to administer Beta Blockers:Not Applicable 

## 2013-02-12 NOTE — H&P (Signed)
PREOPERATIVE H&P  Chief Complaint: right arm pain  HPI: [name] is a 42 y.o. female who presents with ongoing right arm pain. 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  Past Medical History  Diagnosis Date  . PONV (postoperative nausea and vomiting)   . Hypothyroidism     does not take take meds for this  . Kidney stones   . Pancreatitis   . GERD (gastroesophageal reflux disease)    Past Surgical History  Procedure Laterality Date  . Cholecystectomy  11/02  . Laparoscopic gastric banding  02/10   History   Social History  . Marital Status: Married    Spouse Name: N/A    Number of Children: N/A  . Years of Education: N/A   Social History Main Topics  . Smoking status: Never Smoker   . Smokeless tobacco: Never Used  . Alcohol Use: No  . Drug Use: No  . Sexual Activity: None   Other Topics Concern  . None   Social History Narrative  . None   Family History  Problem Relation Age of Onset  . Breast cancer Mother    Allergies  Allergen Reactions  . Tetracyclines & Related    Prior to Admission medications   Medication Sig Start Date End Date Taking? Authorizing Provider  HYDROcodone-acetaminophen (NORCO/VICODIN) 5-325 MG per tablet Take 1 tablet by mouth every 6 (six) hours as needed for pain.   Yes Historical Provider, MD  Multiple Vitamins-Minerals (MULTIVITAMIN WITH MINERALS) tablet Take 1 tablet by mouth daily.   Yes Historical Provider, MD     All other systems have been reviewed and were otherwise negative with the exception of those mentioned in the HPI and as above.  Physical Exam: Filed Vitals:   02/12/13 0709  BP: 120/83  Pulse: 75  Temp: 97.2 F (36.2 C)  Resp: 18    General: Alert, no acute distress Cardiovascular: No pedal edema Respiratory: No cyanosis, no use of accessory musculature Skin: No lesions in the area of chief complaint Neurologic: Sensation intact distally Psychiatric: Patient is competent for  consent with normal mood and affect Lymphatic: No axillary or cervical lymphadenopathy  MUSCULOSKELETAL: + spurling on right  Assessment/Plan: Right arm pain Plan for Procedure(s): ANTERIOR CERVICAL DECOMPRESSION/DISCECTOMY FUSION 1 LEVEL   [name], MD 02/12/2013 8:14 AM

## 2013-02-12 NOTE — Plan of Care (Signed)
Problem: Consults Goal: Diagnosis - Spinal Surgery Outcome: Completed/Met Date Met:  02/12/13 Cervical Spine Fusion     

## 2013-02-12 NOTE — Anesthesia Postprocedure Evaluation (Signed)
Anesthesia Post Note  Patient: [name]  Procedure(s) Performed: Procedure(s) (LRB): ANTERIOR CERVICAL DECOMPRESSION/DISCECTOMY FUSION 1 LEVEL (N/A)  Anesthesia type: General  Patient location: PACU  Post pain: Pain level controlled  Post assessment: Patient's Cardiovascular Status Stable  Last Vitals:  Filed Vitals:   02/12/13 1200  BP:   Pulse: 61  Temp:   Resp: 13    Post vital signs: Reviewed and stable  Level of consciousness: alert  Complications: No apparent anesthesia complications

## 2013-02-12 NOTE — Anesthesia Procedure Notes (Signed)
Procedure Name: Intubation Date/Time: 02/12/2013 8:35 AM Performed by: Lovie Chol Pre-anesthesia Checklist: Patient identified, Emergency Drugs available, Suction available, Patient being monitored and Timeout performed Patient Re-evaluated:Patient Re-evaluated prior to inductionOxygen Delivery Method: Circle system utilized Preoxygenation: Pre-oxygenation with 100% oxygen Intubation Type: IV induction Ventilation: Mask ventilation without difficulty Laryngoscope Size: Miller and 2 Grade View: Grade I Tube type: Oral Tube size: 7.0 mm Number of attempts: 1 Airway Equipment and Method: Stylet Placement Confirmation: ETT inserted through vocal cords under direct vision,  positive ETCO2,  CO2 detector and breath sounds checked- equal and bilateral Secured at: 21 cm Tube secured with: Tape Dental Injury: Teeth and Oropharynx as per pre-operative assessment

## 2013-02-12 NOTE — Progress Notes (Signed)
Patient is in recovery. Denies arm pain or neck pain. Looks very confortable.  BP 123/68  Pulse 79  Temp(Src) 97.6 F (36.4 C) (Oral)  Resp 10  SpO2 100%  NVI Collar fitting approprately Looks very comfortable  S/p 6-7 ACDF doing well with resolution of right arm pain  - will observe overnight, reevaluate in the morning, and likely d/c home tomorrow morning

## 2013-02-13 MED ORDER — HYDROCODONE-ACETAMINOPHEN 5-325 MG PO TABS: 1.0000 | ORAL_TABLET | ORAL | Status: DC | PRN

## 2013-02-13 NOTE — Op Note (Signed)
[name], [name]             ACCOUNT NO.:  [id number]  MEDICAL RECORD NO.:  [id number]  LOCATION:  3C04C                        FACILITY:  [facility]  PHYSICIAN:  Monica Bamberg, MD      DATE OF BIRTH:  [birth year]  DATE OF PROCEDURE:  02/12/2013                              OPERATIVE REPORT   PREOPERATIVE DIAGNOSES: 1. Severe right-sided C7 radiculopathy. 2. 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  POSTOPERATIVE DIAGNOSES: 1. Severe right-sided C7 radiculopathy. 2. 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  PROCEDURES: 1. Anterior cervical decompression and fusion, C6-7. 2. Placement of anterior instrumentation, C6-C7. 3. Insertion of interbody device x1 (7-mm lordotic, medium, Titan     interbody spacer). 4. Use of morselized allograft (DBX Mix). 5. Intraoperative use of fluoroscopy.  SURGEON:  Monica Bamberg, MD  ASSISTANT:  [name]. McKenzie, PA-C  ANESTHESIA:  General endotracheal anesthesia.  COMPLICATIONS:  None.  DISPOSITION:  Stable.  ESTIMATED BLOOD LOSS:  Minimal.  INDICATIONS FOR PROCEDURE:  Briefly, [name] is a very pleasant 42 year old female who did initially present to me on December 27, 2012, with severe pain in the right arm.  The pain was severe.  She was hyporeflexic as well, and she did have a positive Hoffman sign concerning for myelopathy.  I did proceed with getting an MRI of her cervical spine.  This was in fact obtained and I did re-evaluate the patient on January 11, 2013.  The MRI did clearly reveal a large C6-7 disk herniation, causing the flexion of the right hemicord, and causing severe compression of the exiting C7 nerve on the right side.  Given the patient's severe pain and the size of the herniation, we together decided to proceed with an anterior cervical decompression and fusion. The patient did fully understand the risks and limitations of the procedure as outlined in my preoperative note.  OPERATIVE DETAILS:  On February 12, 2013,  the patient was brought to Surgery and general endotracheal anesthesia was administered.  The patient was placed supine on the hospital bed.  The neck was placed in a gentle degree of extension.  All bony prominences were meticulously padded, and the patient's arms were secured to her sides.  The ulnar nerves were protected.  The neck was then prepped and draped in the usual sterile fashion.  Time-out procedure was performed and antibiotics were given.  I then made a midline incision overlying the C6-7 interspace.  The platysma was sharply incised.  The plane between the sternocleidomastoid muscle and the strap muscles was identified and explored.  The carotid artery was palpated and retracted laterally.  The strap muscles and the esophagus were retracted medially.  The anterior cervical spine was readily noted.  I did obtain a lateral intraoperative fluoroscopic view to confirm the appropriate operative level.  I then subperiosteally exposed the vertebral bodies of C6 and C7.  Caspar pins were placed and distraction was applied.  I then used a 15-blade knife to perform an annulotomy anteriorly.  A diskectomy was performed from the anterior to the posterior aspect of the intervertebral space.  The posterior longitudinal ligament was identified and entered using a nerve hook.  I then used a #1 followed by #2 Kerrison  to remove the posterior longitudinal ligament.  Of note, on the right side, it was obvious that there clearly was a large prominence extending into the spinal canal.  I was able to develop a plane between this prominence and the spinal cord. I was able to remove multiple disk fragments, which were causing significant compression of the spinal canal.  I then worked my way towards the neuroforamen.  It was obvious that there was ongoing and significant compression in the right neuroforaminal area.  I did gently use a nerve hook to tease these fragments out of the foraminal area.   I then used a series of #1 Kerrison punches to remove multiple large-sized disk fragments, which were clearly causing compression of the exiting C7 nerve.  In this manner, I was able to entirely alleviate the compression of the exiting C7 nerve on the right.  I then turned my attention towards the endplates, which were prepared using a series of curettes. I then placed a series of trials and I did feel that a 7-mm lordotic implant would be the most appropriate fit.  The implant was packed with DBX Mix and tamped into the appropriate position in the usual fashion. I was very pleased with the press-fit of the implant.  I then applied an anterior cervical plate, 12 mm in height.  Two vertebral body screws were placed through the plate in the C6 vertebral body, and two were placed in C7.  The screws were then locked into position using the CAM locking mechanism.  At this point, the wound was copiously irrigated.  I did use AP and lateral fluoroscopy while placing the hardware, and I was very pleased with the final appearance of the images.  I then closed the platysma using 2-0 Vicryl.  The skin was then closed using 3-0 Monocryl. Benzoin and Steri-Strips were applied followed by sterile dressing.  All instrument counts were correct at the termination of the procedure.  Of note, [name] was my assistant throughout the entirety of the procedure, and did aided in essential retraction and suctioning needed throughout the surgery.     Monica Bamberg, MD     MD/MEDQ  D:  02/12/2013  T:  02/13/2013  Job:  [id number]  cc:   [name], M.D.

## 2013-02-13 NOTE — Progress Notes (Signed)
Patient doing well. Denies arm or neck pain.  BP 104/66  Pulse 67  Temp(Src) 98.6 F (37 C) (Oral)  Resp 18  SpO2 95%  Collar fitting well. NVI  S/p C6/7 ACDF doing well  - d/c home today - f/u 2 weeks

## 2013-02-14 ENCOUNTER — Encounter (HOSPITAL_COMMUNITY): Payer: Self-pay | Admitting: Orthopedic Surgery

## 2013-02-26 NOTE — Discharge Summary (Signed)
Patient ID: [name] MRN: [id number] DOB/AGE: 1971-02-21 42 y.o.  Admit date: 02/12/2013 Discharge date: 02/13/2013  Admission Diagnoses: Cervical Myeloradiculopathy  Discharge Diagnoses:  Same  Past Medical History  Diagnosis Date  . PONV (postoperative nausea and vomiting)   . Hypothyroidism     does not take take meds for this  . Kidney stones   . Pancreatitis   . GERD (gastroesophageal reflux disease)     Surgeries: Procedure(s): ANTERIOR CERVICAL DECOMPRESSION/DISCECTOMY FUSION 1 LEVEL C6-7 on 02/12/2013   Discharged Condition: Improved  Hospital Course: [name] is an 42 y.o. female who was admitted 02/12/2013 for operative treatment of<principal problem not specified>. Patient has severe unremitting pain that affects sleep, daily activities, and work/hobbies. After pre-op clearance the patient was taken to the operating room on 02/12/2013 and underwent  Procedure(s): ANTERIOR CERVICAL DECOMPRESSION/DISCECTOMY FUSION 1 LEVEL C6-7.    Patient was given perioperative antibiotics:  Anti-infectives   Start     Dose/Rate Route Frequency Ordered Stop   02/12/13 1500  ceFAZolin (ANCEF) IVPB 1 g/50 mL premix     1 g 100 mL/hr over 30 Minutes Intravenous Every 8 hours 02/12/13 1409 02/12/13 2235   02/12/13 0600  ceFAZolin (ANCEF) IVPB 2 g/50 mL premix     2 g 100 mL/hr over 30 Minutes Intravenous On call to O.R. 02/11/13 1417 02/12/13 0830       Patient was given sequential compression devices, early ambulation to prevent DVT.  Patient benefited maximally from hospital stay and there were no complications.    Recent vital signs: BP 97/63  Pulse 84  Temp(Src) 98.8 F (37.1 C) (Oral)  Resp 20  SpO2 98%   Discharge Medications:     Medication List         HYDROcodone-acetaminophen 5-325 MG per tablet  Commonly known as:  NORCO  Take 1 tablet by mouth every 4 (four) hours as needed for pain.     multivitamin with minerals tablet  Take 1 tablet by mouth  daily.        Diagnostic Studies: Dg Chest 2 View  02/06/2013   CLINICAL DATA:  Preoperative evaluation for neck surgery 02/12/2013  EXAM: CHEST  2 VIEW  COMPARISON:  01/22/2008  FINDINGS: Normal heart size, mediastinal contours, and pulmonary vascularity.  Lungs clear.  Bones unremarkable.  No pneumothorax.  IMPRESSION: Normal exam.   Electronically Signed   By: Ulyses Southward M.D.   On: 02/06/2013 16:27   Dg Cervical Spine 1 View  02/12/2013   *RADIOLOGY REPORT*  Clinical Data: ACDF C6-C7  DG C-ARM 1-60 MIN,DG CERVICAL SPINE - 1 VIEW  Comparison: Cervical spine MRI - 01/07/2013  Findings:  A single lateral spot radiographic images of the cervical spine is provided for review.  Images demonstrate the sequela of C6 - C7 ACDF and intervertebral disc space replacement.  There has been restoration of the C6 - C7 intervertebral disc space.  Alignment appears anatomic.   There is a minimal amount of expected postoperative prevertebral soft tissue swelling.  Radiopaque support apparatus is seen about the operative site.  An endotracheal tube overlies the tracheal air column with tip excluded from view.  IMPRESSION: Post C6 - C7 ACDF.   Original Report Authenticated By: Tacey Ruiz, MD   Dg C-arm 1-60 Min  02/12/2013   *RADIOLOGY REPORT*  Clinical Data: ACDF C6-C7  DG C-ARM 1-60 MIN,DG CERVICAL SPINE - 1 VIEW  Comparison: Cervical spine MRI - 01/07/2013  Findings:  A single lateral spot  radiographic images of the cervical spine is provided for review.  Images demonstrate the sequela of C6 - C7 ACDF and intervertebral disc space replacement.  There has been restoration of the C6 - C7 intervertebral disc space.  Alignment appears anatomic.   There is a minimal amount of expected postoperative prevertebral soft tissue swelling.  Radiopaque support apparatus is seen about the operative site.  An endotracheal tube overlies the tracheal air column with tip excluded from view.  IMPRESSION: Post C6 - C7 ACDF.   Original  Report Authenticated By: Tacey Ruiz, MD    Disposition: 01-Home or Self Care      Discharge Orders   Future Orders Complete By Expires   Discharge patient  As directed        S/p C6/7 ACDF doing well  - d/c home today  - f/u 2 weeks - Written scripts and d/c instructions printed in chart   Signed: Georga Bora 02/26/2013, 2:43 PM

## 2013-03-20 ENCOUNTER — Other Ambulatory Visit: Payer: Self-pay

## 2013-06-23 ENCOUNTER — Other Ambulatory Visit: Payer: Self-pay

## 2013-06-23 DIAGNOSIS — Z1231 Encounter for screening mammogram for malignant neoplasm of breast: Secondary | ICD-10-CM

## 2013-07-01 ENCOUNTER — Ambulatory Visit
Admission: RE | Admit: 2013-07-01 | Discharge: 2013-07-01 | Disposition: A | Discharge status: Home / Self Care | Payer: BC Managed Care – PPO | Source: Ambulatory Visit

## 2013-07-01 DIAGNOSIS — Z1231 Encounter for screening mammogram for malignant neoplasm of breast: Secondary | ICD-10-CM

## 2014-06-17 ENCOUNTER — Other Ambulatory Visit: Payer: Self-pay

## 2014-06-17 DIAGNOSIS — Z1231 Encounter for screening mammogram for malignant neoplasm of breast: Secondary | ICD-10-CM

## 2014-07-02 ENCOUNTER — Ambulatory Visit
Admission: RE | Admit: 2014-07-02 | Discharge: 2014-07-02 | Disposition: A | Discharge status: Home / Self Care | Payer: BLUE CROSS/BLUE SHIELD | Source: Ambulatory Visit

## 2014-07-02 DIAGNOSIS — Z1231 Encounter for screening mammogram for malignant neoplasm of breast: Secondary | ICD-10-CM

## 2016-02-18 ENCOUNTER — Encounter (HOSPITAL_COMMUNITY): Payer: Self-pay

## 2016-03-13 ENCOUNTER — Telehealth (HOSPITAL_COMMUNITY): Payer: Self-pay

## 2016-03-13 NOTE — Telephone Encounter (Signed)
This patient is overdue for recommended follow-up with [facility]. A letter was mailed to the address on file 02/4616 from both [facility] & [facility] in attempt to reestablish post-op care. The letter included a patient [facility] which [facility]  Patient declined an appointment advising she has achieved goals and does not see need for visit at this time. Will contact [facility] to be seen if need presents itself.  Copy of the survey was shared with Dario GuardianFrances Jackson at [facility] so she may file in patients office chart.

## 2016-05-16 ENCOUNTER — Other Ambulatory Visit: Payer: Self-pay | Admitting: Family Medicine

## 2016-05-16 DIAGNOSIS — Z1231 Encounter for screening mammogram for malignant neoplasm of breast: Secondary | ICD-10-CM

## 2016-07-03 ENCOUNTER — Ambulatory Visit
Admission: RE | Admit: 2016-07-03 | Discharge: 2016-07-03 | Disposition: A | Discharge status: Home / Self Care | Payer: BLUE CROSS/BLUE SHIELD | Source: Ambulatory Visit | Attending: Family Medicine | Admitting: Family Medicine

## 2016-07-03 DIAGNOSIS — Z1231 Encounter for screening mammogram for malignant neoplasm of breast: Secondary | ICD-10-CM

## 2016-07-04 ENCOUNTER — Other Ambulatory Visit: Payer: Self-pay | Admitting: Family Medicine

## 2016-07-04 DIAGNOSIS — R928 Other abnormal and inconclusive findings on diagnostic imaging of breast: Secondary | ICD-10-CM

## 2016-07-07 ENCOUNTER — Ambulatory Visit
Admission: RE | Admit: 2016-07-07 | Discharge: 2016-07-07 | Disposition: A | Discharge status: Home / Self Care | Payer: BLUE CROSS/BLUE SHIELD | Source: Ambulatory Visit | Attending: Family Medicine | Admitting: Family Medicine

## 2016-07-07 DIAGNOSIS — R928 Other abnormal and inconclusive findings on diagnostic imaging of breast: Secondary | ICD-10-CM

## 2017-02-05 ENCOUNTER — Encounter (HOSPITAL_COMMUNITY): Payer: Self-pay

## 2017-06-25 ENCOUNTER — Other Ambulatory Visit: Payer: Self-pay | Admitting: Family Medicine

## 2017-06-25 DIAGNOSIS — Z1231 Encounter for screening mammogram for malignant neoplasm of breast: Secondary | ICD-10-CM

## 2017-07-16 ENCOUNTER — Ambulatory Visit: Payer: BLUE CROSS/BLUE SHIELD

## 2020-10-08 ENCOUNTER — Other Ambulatory Visit: Payer: Self-pay

## 2020-10-08 ENCOUNTER — Emergency Department (HOSPITAL_COMMUNITY): Payer: BC Managed Care – PPO

## 2020-10-08 ENCOUNTER — Emergency Department (HOSPITAL_COMMUNITY)
Admission: EM | Admit: 2020-10-08 | Discharge: 2020-10-08 | Disposition: A | Discharge status: Home / Self Care | Payer: BC Managed Care – PPO | Attending: Emergency Medicine | Admitting: Emergency Medicine

## 2020-10-08 DIAGNOSIS — E039 Hypothyroidism, unspecified: Secondary | ICD-10-CM | POA: Diagnosis not present

## 2020-10-08 DIAGNOSIS — R569 Unspecified convulsions: Secondary | ICD-10-CM | POA: Insufficient documentation

## 2020-10-08 LAB — CBC WITH DIFFERENTIAL/PLATELET
Abs Immature Granulocytes: 0.02 10*3/uL (ref 0.00–0.07)
Basophils Absolute: 0.1 10*3/uL (ref 0.0–0.1)
Basophils Relative: 1 %
Eosinophils Absolute: 0.2 10*3/uL (ref 0.0–0.5)
Eosinophils Relative: 4 %
HCT: 33.5 % — ABNORMAL LOW (ref 36.0–46.0)
Hemoglobin: 10.4 g/dL — ABNORMAL LOW (ref 12.0–15.0)
Immature Granulocytes: 0 %
Lymphocytes Relative: 23 %
Lymphs Abs: 1.4 10*3/uL (ref 0.7–4.0)
MCH: 26.6 pg (ref 26.0–34.0)
MCHC: 31 g/dL (ref 30.0–36.0)
MCV: 85.7 fL (ref 80.0–100.0)
Monocytes Absolute: 0.5 10*3/uL (ref 0.1–1.0)
Monocytes Relative: 8 %
Neutro Abs: 3.8 10*3/uL (ref 1.7–7.7)
Neutrophils Relative %: 64 %
Platelets: 299 10*3/uL (ref 150–400)
RBC: 3.91 MIL/uL (ref 3.87–5.11)
RDW: 15.3 % (ref 11.5–15.5)
WBC: 5.9 10*3/uL (ref 4.0–10.5)
nRBC: 0 % (ref 0.0–0.2)

## 2020-10-08 LAB — COMPREHENSIVE METABOLIC PANEL
ALT: 13 U/L (ref 0–44)
AST: 19 U/L (ref 15–41)
Albumin: 3.5 g/dL (ref 3.5–5.0)
Alkaline Phosphatase: 89 U/L (ref 38–126)
Anion gap: 6 (ref 5–15)
BUN: 6 mg/dL (ref 6–20)
CO2: 24 mmol/L (ref 22–32)
Calcium: 9 mg/dL (ref 8.9–10.3)
Chloride: 108 mmol/L (ref 98–111)
Creatinine, Ser: 0.86 mg/dL (ref 0.44–1.00)
GFR, Estimated: >60 mL/min (ref >60)
Glucose, Bld: 108 mg/dL — ABNORMAL HIGH (ref 70–99)
Potassium: 3.8 mmol/L (ref 3.5–5.1)
Sodium: 138 mmol/L (ref 135–145)
Total Bilirubin: 0.9 mg/dL (ref 0.3–1.2)
Total Protein: 6.5 g/dL (ref 6.5–8.1)

## 2020-10-08 LAB — I-STAT BETA HCG BLOOD, ED (MC, WL, AP ONLY): I-stat hCG, quantitative: <5 m[IU]/mL (ref <5)

## 2020-10-08 LAB — MAGNESIUM: Magnesium: 2 mg/dL (ref 1.7–2.4)

## 2020-10-08 LAB — CBG MONITORING, ED: Glucose-Capillary: 90 mg/dL (ref 70–99)

## 2020-10-08 LAB — PHOSPHORUS: Phosphorus: 2.3 mg/dL — ABNORMAL LOW (ref 2.5–4.6)

## 2020-10-08 NOTE — ED Provider Notes (Signed)
[name] EMERGENCY DEPARTMENT Provider Note   CSN: [id number] Arrival date & time: 10/08/20  1516     History Chief Complaint  Patient presents with  . Seizures    Sz- no hx    [name] is a 50 y.o. female.  HPI    50 y.o comes in with cc of seizures. Patient has history of hypertension and is on Wellbutrin.  She reports that she was at work when she had a sudden seizure-like episode.  She does not remember any of it.  The husband is at the bedside, reports that the boss called him and reported that she heard the commotion and when she went out patient was on the floor and had a seizure.  Patient was confused after the event.  She did bite her tongue.    There is no history of prior seizure.  Patient denies any headaches, neck trauma, pain in the extremities or back.  She denies any history of seizure as a child and there is no history of seizures in the family.  There is no brain aneurysm, brain bleed in the family.  Patient denies any substance use, heavy alcohol.  Patient's Wellbutrin was increased from 150 to 300 mg last month.  There is increased stress because of job and dog being sick.   Past Medical History:  Diagnosis Date  . GERD (gastroesophageal reflux disease)   . Hypothyroidism    does not take take meds for this  . Kidney stones   . Pancreatitis   . PONV (postoperative nausea and vomiting)     There are no problems to display for this patient.   Past Surgical History:  Procedure Laterality Date  . ANTERIOR CERVICAL DECOMP/DISCECTOMY FUSION N/A 02/12/2013   Procedure: ANTERIOR CERVICAL DECOMPRESSION/DISCECTOMY FUSION 1 LEVEL;  Surgeon: [name], MD;  Location: [facility] OR;  Service: Orthopedics;  Laterality: N/A;  Anterior cervical decompression and fusion, cervical 6-7 with instrumentation and allograft.   . CHOLECYSTECTOMY  11/02  . LAPAROSCOPIC GASTRIC BANDING  02/10     OB History   No obstetric history on file.      Family History  Problem Relation Age of Onset  . Breast cancer Mother     Social History   Tobacco Use  . Smoking status: Never Smoker  . Smokeless tobacco: Never Used  Substance Use Topics  . Alcohol use: No  . Drug use: No    Home Medications Prior to Admission medications   Medication Sig Start Date End Date Taking? Authorizing Provider  HYDROcodone-acetaminophen (NORCO) 5-325 MG per tablet Take 1 tablet by mouth every 4 (four) hours as needed for pain. 02/13/13   Estill Bamberg, MD  Multiple Vitamins-Minerals (MULTIVITAMIN WITH MINERALS) tablet Take 1 tablet by mouth daily.    [provider]    Allergies    Tetracyclines & related  Review of Systems   Review of Systems  Constitutional: Positive for activity change.  Respiratory: Negative for shortness of breath.   Cardiovascular: Negative for chest pain.  Neurological: Negative for headaches.  Hematological: Does not bruise/bleed easily.  All other systems reviewed and are negative.   Physical Exam Updated Vital Signs BP 123/65   Pulse 95   Temp 98.5 F (36.9 C) (Oral)   Resp 16   Ht 5\' 8"  (1.727 m)   Wt 81.6 kg   SpO2 100%   BMI 27.37 kg/m   Physical Exam Vitals and nursing note reviewed.  Constitutional:  Appearance: She is well-developed.  HENT:     Head: Normocephalic and atraumatic.  Cardiovascular:     Rate and Rhythm: Normal rate.  Pulmonary:     Effort: Pulmonary effort is normal.  Abdominal:     General: Bowel sounds are normal.  Musculoskeletal:     Cervical back: Normal range of motion and neck supple.  Skin:    General: Skin is warm and dry.  Neurological:     General: No focal deficit present.     Mental Status: She is alert and oriented to person, place, and time.     Cranial Nerves: No cranial nerve deficit.     ED Results / Procedures / Treatments   Labs (all labs ordered are listed, but only abnormal results are displayed) Labs Reviewed  CBC WITH  DIFFERENTIAL/PLATELET - Abnormal; Notable for the following components:      Result Value   Hemoglobin 10.4 (*)    HCT 33.5 (*)    All other components within normal limits  COMPREHENSIVE METABOLIC PANEL - Abnormal; Notable for the following components:   Glucose, Bld 108 (*)    All other components within normal limits  PHOSPHORUS - Abnormal; Notable for the following components:   Phosphorus 2.3 (*)    All other components within normal limits  MAGNESIUM  CBG MONITORING, ED  I-STAT BETA HCG BLOOD, ED ([facility], [facility], [facility] ONLY)    EKG EKG Interpretation  Date/Time:  Friday Oct 08 2020 15:45:28 EDT Ventricular Rate:  104 PR Interval:  158 QRS Duration: 104 QT Interval:  345 QTC Calculation: 454 R Axis:   66 Text Interpretation: Sinus tachycardia RSR' in V1 or V2, right VCD or RVH No acute changes No significant change since last tracing Confirmed by Derwood Kaplan [phone]) on 10/08/2020 4:08:58 PM   Radiology No results found.  Procedures Procedures   Medications Ordered in ED Medications - No data to display  ED Course  I have reviewed the triage vital signs and the nursing notes.  Pertinent labs & imaging results that were available during my care of the patient were reviewed by me and considered in my medical decision making (see chart for details).    MDM Rules/Calculators/A&P                          50 year old comes in with cc of seizure.  DDx: -Seizure disorder -Meningitis -Trauma -ICH -Electrolyte abnormality -Metabolic derangement -Stroke -Toxin induced seizures -Medication side effects -Hypoxia -Hypoglycemia  New episode. Will get Ct head and basic labs. Dr. Myrtis Ser will f/u.   Final Clinical Impression(s) / ED Diagnoses Final diagnoses:  Seizure [facility])    Rx / DC Orders ED Discharge Orders    None       Derwood Kaplan, MD 10/11/20 [phone]

## 2020-10-08 NOTE — ED Triage Notes (Signed)
Seizure like activity witnessed by co-workers, may have happened a month ago as well. Controlled slide to floor. Lasted 5-7 minutes. Shaking noted and eyes rolling to back of her head. Patient now alert and orientedx4. VSS Patient did bite through her tongue.

## 2020-10-08 NOTE — ED Provider Notes (Signed)
Medical Decision Making: Care of patient assumed from Dr. Salina April at 1630.  Agree with history, physical exam and plan.  See their note for further details.  Briefly, The pt p/w new first time seizure, generalized tonic clonic.   Current plan is as follows: CT, labs, reassess  Labs and CT imaging reviewed by myself showed no significant derangements.  Mild hypophosphatemia mild anemia.  She has a history of anemia with heavy menstrual cycles.  Not likely to explain her symptoms today.  She will be discharged home with outpatient follow-up.  Seizure precautions return precautions discussed    I personally reviewed and interpreted all labs/imaging.      Sabino Donovan, MD 10/08/20 [phone]

## 2020-10-08 NOTE — ED Notes (Signed)
Coworker, Pam would like for pt to know she is here. Currently, awaiting for pt's husband to arrive.

## 2020-10-08 NOTE — ED Notes (Signed)
Patient takes vyvanse and wellbutrin. Took both today. Does not remember episode, denies any aura prior to episode.

## 2020-10-08 NOTE — Discharge Instructions (Addendum)
We saw you in the ER for seizures. All the results in the ER are normal, labs and imaging.  The initial work-up for your seizure is negative.  Please follow-up with a neurologist as requested.  Return to the ER if there is a repeat episode of seizure.  As discussed, antiepileptic medications are not started after first episode of seizure given that it might never return again in half of the people with first time seizure.  [place] law prevents people with seizures or fainting from driving or operating dangerous machinery until they are free of seizures or fainting for 6 months.

## 2020-10-14 ENCOUNTER — Ambulatory Visit (INDEPENDENT_AMBULATORY_CARE_PROVIDER_SITE_OTHER): Payer: BC Managed Care – PPO | Admitting: Neurology

## 2020-10-14 ENCOUNTER — Encounter: Payer: Self-pay | Admitting: Neurology

## 2020-10-14 ENCOUNTER — Other Ambulatory Visit: Payer: Self-pay

## 2020-10-14 VITALS — BP 130/90 | HR 96 | Ht 68.0 in | Wt 181.5 lb

## 2020-10-14 DIAGNOSIS — F32A Depression, unspecified: Secondary | ICD-10-CM

## 2020-10-14 DIAGNOSIS — D649 Anemia, unspecified: Secondary | ICD-10-CM | POA: Diagnosis not present

## 2020-10-14 DIAGNOSIS — G40909 Epilepsy, unspecified, not intractable, without status epilepticus: Secondary | ICD-10-CM | POA: Diagnosis not present

## 2020-10-14 NOTE — Progress Notes (Signed)
Chief Complaint  Patient presents with  . New Patient (Initial Visit)    Hospital follow up for first time seizure event on 10/08/20.      ASSESSMENT AND PLAN  [name] is a 50 y.o. female   Generalized tonic-clonic seizure on Oct 08, 2020  This happened 5 days after mistakenly sudden titration of her Wellbutrin XR from 150 mg to 600 mg daily, she is now back on Wellbutrin 300 mg daily  Normal neurological examination,  CT head without contrast was normal  Will complete evaluation with MRI of the brain with without contrast  EEG  Laboratory evaluations  No driving until seizure-free for 6 months Chronic anemia  Iron panel  DIAGNOSTIC DATA (LABS, IMAGING, TESTING) - I reviewed patient records, labs, notes, testing and imaging myself where available.   CT head without contrast on Oct 08, 2020: Negative noncontrast head CT.  Laboratory evaluations: Oct 08, 2020: Normal CMP with exception of elevated glucose 108, CBC showed anemia hemoglobin of 10.4,  HISTORICAL  [name] is a 50 year old female, seen in request by her primary care PA Richmond Campbell.  for evaluation of seizure, initial evaluation was on October 14, 2020  I reviewed and summarized the referring note.  Past medical history Kidney stone History of cervical decompression surgery in 2004 Depression, on Wellbutrin XR 300 mg daily ADHD, Vyvanse 70 mg daily  Patient works as a IT consultant, desk job, had a history of ADHD, has been taking Vyvanse 70 mg daily for many years, around 2021, she was treated with Wellbutrin XR 150 mg daily for depression, which has been helpful, the dosage was increased to Wellbutrin xr 150 mg 2 tablets every night on Oct 02, 2020, but she was also giving a new prescription of Wellbutrin xr 300 mg every day, patient mistakenly taking Wellbutrin 600 mg xr 2 tablets every day from May 21  To 27 2022  Oct 08, 2020, while working at her desk, without any warning signs, she fell to  the ground had a witnessed generalized tonic-clonic seizure, her coworker called ambulance, patient reported remembering typing in front of her computer, then woke up on the floor, with left lateral tongue biting, paramedic was surrounding her,  She was taken to the emergency room, CT head without contrast showed no significant abnormality  Laboratory evaluation showed normal CMP other than glucose 108, CBC showed anemia hemoglobin of 10.4,    This is the first time she ever had seizure, no family history of seizure, no history of traumatic brain injury, she did have a history of cervical decompression surgery in the past, presented with severe neck pain, radiating pain to right upper extremity prior to surgery, cervical decompression has been very helpful.   PHYSICAL EXAM:   Vitals:   10/14/20 0931  BP: 130/90  Pulse: 96  Weight: 181 lb 8 oz (82.3 kg)  Height: 5\' 8"  (1.727 m)   Not recorded     Body mass index is 27.6 kg/m.  PHYSICAL EXAMNIATION:  Gen: NAD, conversant, well nourised, well groomed                     Cardiovascular: Regular rate rhythm, no peripheral edema, warm, nontender. Eyes: Conjunctivae clear without exudates or hemorrhage Neck: Supple, no carotid bruits. Pulmonary: Clear to auscultation bilaterally   NEUROLOGICAL EXAM:  MENTAL STATUS: Speech:    Speech is normal; fluent and spontaneous with normal comprehension.  Cognition:     Orientation to time,  place and person     Normal recent and remote memory     Normal Attention span and concentration     Normal Language, naming, repeating,spontaneous speech     Fund of knowledge   CRANIAL NERVES: CN II: Visual fields are full to confrontation. Pupils are round equal and briskly reactive to light. CN III, IV, VI: extraocular movement are normal. No ptosis. CN V: Facial sensation is intact to light touch CN VII: Face is symmetric with normal eye closure  CN VIII: Hearing is normal to causal  conversation. CN IX, X: Phonation is normal. CN XI: Head turning and shoulder shrug are intact  MOTOR: There is no pronator drift of out-stretched arms. Muscle bulk and tone are normal. Muscle strength is normal.  REFLEXES: Reflexes are 2+ and symmetric at the biceps, triceps, knees, and ankles. Plantar responses are flexor.  SENSORY: Intact to light touch, pinprick and vibratory sensation are intact in fingers and toes.  COORDINATION: There is no trunk or limb dysmetria noted.  GAIT/STANCE: Posture is normal. Gait is steady with normal steps, base, arm swing, and turning. Heel and toe walking are normal. Tandem gait is normal.  Romberg is absent.  REVIEW OF SYSTEMS:  Full 14 system review of systems performed and notable only for as above All other review of systems were negative.   ALLERGIES: Allergies  Allergen Reactions  . Codeine Itching  . Tetracyclines & Related     HOME MEDICATIONS: Current Outpatient Medications  Medication Sig Dispense Refill  . buPROPion (WELLBUTRIN XL) 300 MG 24 hr tablet Take 1 tablet by mouth every morning.    . Multiple Vitamins-Minerals (MULTIVITAMIN WITH MINERALS) tablet Take 1 tablet by mouth daily.    Marland Kitchen VYVANSE 70 MG capsule Take 70 mg by mouth every morning.     No current facility-administered medications for this visit.    PAST MEDICAL HISTORY: Past Medical History:  Diagnosis Date  . GERD (gastroesophageal reflux disease)   . Hypothyroidism    does not take take meds for this  . Kidney stones   . Pancreatitis   . PONV (postoperative nausea and vomiting)     PAST SURGICAL HISTORY: Past Surgical History:  Procedure Laterality Date  . ANTERIOR CERVICAL DECOMP/DISCECTOMY FUSION N/A 02/12/2013   Procedure: ANTERIOR CERVICAL DECOMPRESSION/DISCECTOMY FUSION 1 LEVEL;  Surgeon: [name], MD;  Location: [facility] OR;  Service: Orthopedics;  Laterality: N/A;  Anterior cervical decompression and fusion, cervical 6-7 with  instrumentation and allograft.   . CHOLECYSTECTOMY  11/02  . LAPAROSCOPIC GASTRIC BANDING  02/10    FAMILY HISTORY: Family History  Problem Relation Age of Onset  . Breast cancer Mother   . Alcoholism Father   . Heart Problems Father     SOCIAL HISTORY: Social History   Socioeconomic History  . Marital status: Married    Spouse name: Not on file  . Number of children: 2  . Years of education: college  . Highest education level: Bachelor's degree (e.g., BA, AB, BS)  Occupational History  . Not on file  Tobacco Use  . Smoking status: Never Smoker  . Smokeless tobacco: Never Used  Substance and Sexual Activity  . Alcohol use: No  . Drug use: No  . Sexual activity: Not on file  Other Topics Concern  . Not on file  Social History Narrative   Lives at home with her husband.   2-3 cans of soda.   Right-handed.   Social Determinants of Health  Financial Resource Strain: Not on file  Food Insecurity: Not on file  Transportation Needs: Not on file  Physical Activity: Not on file  Stress: Not on file  Social Connections: Not on file  Intimate Partner Violence: Not on file      Levert Feinstein, M.D. Ph.D.  [facility] [address] [place], [place] [address] Ph: [phone] Fax: [phone]  CC:  Richmond Campbell., PA-C [address],  [place] [address]  Richmond Campbell., PA-C

## 2020-10-15 LAB — IRON AND TIBC
Iron Saturation: 9 % — CL (ref 15–55)
Iron: 36 ug/dL (ref 27–159)
Total Iron Binding Capacity: 406 ug/dL (ref 250–450)
UIBC: 370 ug/dL (ref 131–425)

## 2020-10-15 LAB — THYROID PANEL WITH TSH
Free Thyroxine Index: 1.6 (ref 1.2–4.9)
T3 Uptake Ratio: 24 % (ref 24–39)
T4, Total: 6.8 ug/dL (ref 4.5–12.0)
TSH: 4.26 u[IU]/mL (ref 0.450–4.500)

## 2020-10-15 LAB — FOLATE: Folate: 9.4 ng/mL (ref >3.0)

## 2020-10-15 LAB — FERRITIN: Ferritin: 7 ng/mL — ABNORMAL LOW (ref 15–150)

## 2020-10-15 LAB — VITAMIN B12: Vitamin B-12: 546 pg/mL (ref 232–1245)

## 2020-10-18 ENCOUNTER — Telehealth: Payer: Self-pay | Admitting: Neurology

## 2020-10-18 NOTE — Telephone Encounter (Signed)
MR brain w/wo contrast bcbs auths: [id number] (10/18/20-11/16/20) & [id number] (10/18/20-12/16/20)  Sent to GI for scheduling

## 2020-10-19 ENCOUNTER — Other Ambulatory Visit: Payer: Self-pay | Admitting: Neurology

## 2020-10-19 ENCOUNTER — Telehealth: Payer: Self-pay | Admitting: *Deleted

## 2020-10-19 MED ORDER — FERROUS GLUCONATE 324 (38 FE) MG PO TABS: 324.0000 mg | ORAL_TABLET | Freq: Every day | ORAL | 1 refills | Status: DC

## 2020-10-19 NOTE — Telephone Encounter (Signed)
-----   Message from York Spaniel, MD sent at 10/19/2020  4:07 PM EDT ----- Blood work is unremarkable exception of a low ferritin level and low iron saturation, patient does have a history of anemia, we recommend getting on iron supplementation, I will call in a prescription for ferrous gluconate for the patient, please call her and let her know. ----- Message ----- From: Lilla Shook, RN Sent: 10/19/2020   8:36 AM EDT To: York Spaniel, MD  Dr. Zannie Cove patient.

## 2020-10-19 NOTE — Telephone Encounter (Signed)
I spoke to the patient. She verbalized understanding of the lab results. She is agreeable to start the prescribed iron supplement. Educated her to take with food to avoid upset stomach. She will also schedule a follow up with her PCP to address the anemia.

## 2020-10-25 ENCOUNTER — Ambulatory Visit (INDEPENDENT_AMBULATORY_CARE_PROVIDER_SITE_OTHER): Payer: BC Managed Care – PPO | Admitting: Neurology

## 2020-10-25 DIAGNOSIS — G40909 Epilepsy, unspecified, not intractable, without status epilepticus: Secondary | ICD-10-CM | POA: Diagnosis not present

## 2020-10-25 DIAGNOSIS — F32A Depression, unspecified: Secondary | ICD-10-CM

## 2020-11-08 NOTE — Procedures (Signed)
   HISTORY: 50 years old female with seizure on Oct 08 2020 TECHNIQUE:  This is a routine 16 channel EEG recording with one channel devoted to a limited EKG recording.  It was performed during wakefulness, drowsiness and asleep.  Hyperventilation and photic stimulation were performed as activating procedures.  There are minimum muscle and movement artifact noted.  Upon maximum arousal, posterior dominant waking rhythm consistent of rhythmic alpha range activity  Activities are symmetric over the bilateral posterior derivations and attenuated with eye opening.  Hyperventilation produced mild/moderate buildup with higher amplitude and the slower activities noted.  Photic stimulation did not alter the tracing.  During EEG recording, patient developed drowsiness and no deep stage of sleep  During EEG recording, there was no epileptiform discharge noted.  EKG demonstrate sinus rhythm, with heart rate of 84 bpm  CONCLUSION: This is a  normal awake EEG.  There is no electrodiagnostic evidence of epileptiform discharge.  Levert Feinstein, M.D. Ph.D.  [facility] [address] [place], [place] [address] Phone: [phone] Fax:      [phone]

## 2021-05-17 NOTE — Progress Notes (Signed)
PATIENT: [name] DOB: February 13, 1971  REASON FOR VISIT: Follow up for seizure HISTORY FROM: Patient PRIMARY NEUROLOGIST: Dr. Terrace Arabia   HISTORY  [name] is a 51 year old female, seen in request by her primary care PA Richmond Campbell.  for evaluation of seizure, initial evaluation was on October 14, 2020   I reviewed and summarized the referring note.  Past medical history Kidney stone History of cervical decompression surgery in 2004 Depression, on Wellbutrin XR 300 mg daily ADHD, Vyvanse 70 mg daily   Patient works as a IT consultant, desk job, had a history of ADHD, has been taking Vyvanse 70 mg daily for many years, around 2021, she was treated with Wellbutrin XR 150 mg daily for depression, which has been helpful, the dosage was increased to Wellbutrin xr 150 mg 2 tablets every night on Oct 02, 2020, but she was also giving a new prescription of Wellbutrin xr 300 mg every day, patient mistakenly taking Wellbutrin 600 mg xr 2 tablets every day from May 21  To 27 2022   Oct 08, 2020, while working at her desk, without any warning signs, she fell to the ground had a witnessed generalized tonic-clonic seizure, her coworker called ambulance, patient reported remembering typing in front of her computer, then woke up on the floor, with left lateral tongue biting, paramedic was surrounding her,   She was taken to the emergency room, CT head without contrast showed no significant abnormality   Laboratory evaluation showed normal CMP other than glucose 108, CBC showed anemia hemoglobin of 10.4,     This is the first time she ever had seizure, no family history of seizure, no history of traumatic brain injury, she did have a history of cervical decompression surgery in the past, presented with severe neck pain, radiating pain to right upper extremity prior to surgery, cervical decompression has been very helpful.  Update May 18, 2021 SS: Here today alone, continues to do well, no recurrent  seizure spell.  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 Her 77 year old daughter just diagnosed with Type 1 DM.   Laboratory evaluation in June 2022 Thyroid panel was unremarkable,  B12, folate were normal, ferritin level was low at 7.  Oral iron supplementation was started. Took the last 1 over the weekend. Needs to get blood work from primary care. On multivitamin.   EEG was normal June 2022.  MRI of the brain was not completed.  REVIEW OF SYSTEMS: Out of a complete 14 system review of symptoms, the patient complains only of the following symptoms, and all other reviewed systems are negative.  N/a  ALLERGIES: Allergies  Allergen Reactions   Codeine Itching   Tetracyclines & Related     HOME MEDICATIONS: Outpatient Medications Prior to Visit  Medication Sig Dispense Refill   buPROPion (WELLBUTRIN XL) 300 MG 24 hr tablet Take 1 tablet by mouth every morning.     Multiple Vitamins-Minerals (MULTIVITAMIN WITH MINERALS) tablet Take 1 tablet by mouth daily.     VYVANSE 70 MG capsule Take 70 mg by mouth every morning.     ferrous gluconate (FERGON) 324 MG tablet Take 1 tablet (324 mg total) by mouth daily with breakfast. 90 tablet 1   No facility-administered medications prior to visit.    PAST MEDICAL HISTORY: Past Medical History:  Diagnosis Date   GERD (gastroesophageal reflux disease)    Hypothyroidism    does not take take meds for this   Kidney stones  Pancreatitis    PONV (postoperative nausea and vomiting)     PAST SURGICAL HISTORY: Past Surgical History:  Procedure Laterality Date   ANTERIOR CERVICAL DECOMP/DISCECTOMY FUSION N/A 02/12/2013   Procedure: ANTERIOR CERVICAL DECOMPRESSION/DISCECTOMY FUSION 1 LEVEL;  Surgeon: Emilee HeroMark Leonard Dumonski, MD;  Location: [facility] OR;  Service: Orthopedics;  Laterality: N/A;  Anterior cervical decompression and fusion, cervical 6-7 with instrumentation and allograft.    CHOLECYSTECTOMY  11/02   LAPAROSCOPIC GASTRIC BANDING   02/10    FAMILY HISTORY: Family History  Problem Relation Age of Onset   Breast cancer Mother    Alcoholism Father    Heart Problems Father     SOCIAL HISTORY: Social History   Socioeconomic History   Marital status: Married    Spouse name: Not on file   Number of children: 2   Years of education: college   Highest education level: Bachelor's degree (e.g., BA, AB, BS)  Occupational History   Not on file  Tobacco Use   Smoking status: Never   Smokeless tobacco: Never  Substance and Sexual Activity   Alcohol use: No   Drug use: No   Sexual activity: Not on file  Other Topics Concern   Not on file  Social History Narrative   Lives at home with her husband.   2-3 cans of soda.   Right-handed.   Social Determinants of Health   Financial Resource Strain: Not on file  Food Insecurity: Not on file  Transportation Needs: Not on file  Physical Activity: Not on file  Stress: Not on file  Social Connections: Not on file  Intimate Partner Violence: Not on file      PHYSICAL EXAM  Vitals:   05/18/21 0947  BP: 113/85  Pulse: 93  Weight: 179 lb 8 oz (81.4 kg)  Height: 5\' 8"  (1.727 m)   Body mass index is 27.29 kg/m.  Generalized: Well developed, in no acute distress, very pleasant  Neurological examination  Mentation: Alert oriented to time, place, history taking. Follows all commands speech and language fluent Cranial nerve II-XII: Pupils were equal round reactive to light. Extraocular movements were full, visual field were full on confrontational test. Facial sensation and strength were normal. Head turning and shoulder shrug  were normal and symmetric. Motor: The motor testing reveals 5 over 5 strength of all 4 extremities. Good symmetric motor tone is noted throughout.  Sensory: Sensory testing is intact to soft touch on all 4 extremities. No evidence of extinction is noted.  Coordination: Cerebellar testing reveals good finger-nose-finger and heel-to-shin  bilaterally.  Gait and station: Gait is normal.  Reflexes: Deep tendon reflexes are symmetric and normal bilaterally.   DIAGNOSTIC DATA (LABS, IMAGING, TESTING) - I reviewed patient records, labs, notes, testing and imaging myself where available.  Lab Results  Component Value Date   WBC 5.9 10/08/2020   HGB 10.4 (L) 10/08/2020   HCT 33.5 (L) 10/08/2020   MCV 85.7 10/08/2020   PLT 299 10/08/2020      Component Value Date/Time   NA 138 10/08/2020 1556   K 3.8 10/08/2020 1556   CL 108 10/08/2020 1556   CO2 24 10/08/2020 1556   GLUCOSE 108 (H) 10/08/2020 1556   BUN 6 10/08/2020 1556   CREATININE 0.86 10/08/2020 1556   CALCIUM 9.0 10/08/2020 1556   PROT 6.5 10/08/2020 1556   ALBUMIN 3.5 10/08/2020 1556   AST 19 10/08/2020 1556   ALT 13 10/08/2020 1556   ALKPHOS 89 10/08/2020 1556   BILITOT  0.9 10/08/2020 1556   GFRNONAA >60 10/08/2020 1556   GFRAA >90 02/06/2013 1551   No results found for: CHOL, HDL, LDLCALC, LDLDIRECT, TRIG, CHOLHDL No results found for: NIOE7O Lab Results  Component Value Date   VITAMINB12 546 10/14/2020   Lab Results  Component Value Date   TSH 4.260 10/14/2020   ASSESSMENT AND PLAN 51 y.o. year old female  has a past medical history of GERD (gastroesophageal reflux disease), Hypothyroidism, Kidney stones, Pancreatitis, and PONV (postoperative nausea and vomiting). here with:  1.  Generalized tonic-clonic seizure Oct 08, 2020 2.  Chronic anemia  -Doing well, no recurrent seizure spells -Single seizure spell, Occurred in the setting of setting titration Wellbutrin XR 150 mg to 600 mg daily -CT head without contrast was normal -EEG was normal in June 2022 -Laboratory evaluation was unremarkable with the exception of low ferritin, just completed oral iron -MRI of the brain has yet to be completed, we will get this set up -Will recheck ferritin, iron, CBC, BMP today  -Call for any spells, otherwise follow-up 1 year or sooner if needed  Margie Ege, AGNP-C, DNP 05/18/2021, 11:06 AM [facility] [address], [address] [place], [place] [address] [phone]

## 2021-05-18 ENCOUNTER — Ambulatory Visit (INDEPENDENT_AMBULATORY_CARE_PROVIDER_SITE_OTHER): Payer: BLUE CROSS/BLUE SHIELD | Admitting: Neurology

## 2021-05-18 ENCOUNTER — Encounter: Payer: Self-pay | Admitting: Neurology

## 2021-05-18 ENCOUNTER — Telehealth: Payer: Self-pay | Admitting: Neurology

## 2021-05-18 VITALS — BP 113/85 | HR 93 | Ht 68.0 in | Wt 179.5 lb

## 2021-05-18 DIAGNOSIS — G40909 Epilepsy, unspecified, not intractable, without status epilepticus: Secondary | ICD-10-CM

## 2021-05-18 DIAGNOSIS — D649 Anemia, unspecified: Secondary | ICD-10-CM | POA: Diagnosis not present

## 2021-05-18 NOTE — Patient Instructions (Signed)
Check mri of the brain, will let you know about scheduling Check labs today  Call for any seizures  See you back in 1 year

## 2021-05-18 NOTE — Telephone Encounter (Signed)
MRI of the brain was ordered at last visit, was not done, do you need a new order? Can you let her know what is needed to get this done? Thanks!

## 2021-05-19 LAB — CBC WITH DIFFERENTIAL/PLATELET
Basophils Absolute: 0.1 10*3/uL (ref 0.0–0.2)
Basos: 1 %
EOS (ABSOLUTE): 0.3 10*3/uL (ref 0.0–0.4)
Eos: 5 %
Hematocrit: 39.2 % (ref 34.0–46.6)
Hemoglobin: 13 g/dL (ref 11.1–15.9)
Immature Grans (Abs): 0 10*3/uL (ref 0.0–0.1)
Immature Granulocytes: 0 %
Lymphocytes Absolute: 2.1 10*3/uL (ref 0.7–3.1)
Lymphs: 35 %
MCH: 28.5 pg (ref 26.6–33.0)
MCHC: 33.2 g/dL (ref 31.5–35.7)
MCV: 86 fL (ref 79–97)
Monocytes Absolute: 0.5 10*3/uL (ref 0.1–0.9)
Monocytes: 8 %
Neutrophils Absolute: 3.1 10*3/uL (ref 1.4–7.0)
Neutrophils: 51 %
Platelets: 325 10*3/uL (ref 150–450)
RBC: 4.56 x10E6/uL (ref 3.77–5.28)
RDW: 11.9 % (ref 11.7–15.4)
WBC: 6.1 10*3/uL (ref 3.4–10.8)

## 2021-05-19 LAB — BASIC METABOLIC PANEL
BUN/Creatinine Ratio: 10 (ref 9–23)
BUN: 7 mg/dL (ref 6–24)
CO2: 26 mmol/L (ref 20–29)
Calcium: 9.5 mg/dL (ref 8.7–10.2)
Chloride: 103 mmol/L (ref 96–106)
Creatinine, Ser: 0.71 mg/dL (ref 0.57–1.00)
Glucose: 80 mg/dL (ref 70–99)
Potassium: 4.7 mmol/L (ref 3.5–5.2)
Sodium: 141 mmol/L (ref 134–144)
eGFR: 105 mL/min/{1.73_m2} (ref >59)

## 2021-05-19 LAB — IRON,TIBC AND FERRITIN PANEL
Ferritin: 16 ng/mL (ref 15–150)
Iron Saturation: 29 % (ref 15–55)
Iron: 113 ug/dL (ref 27–159)
Total Iron Binding Capacity: 390 ug/dL (ref 250–450)
UIBC: 277 ug/dL (ref 131–425)

## 2021-05-24 NOTE — Telephone Encounter (Signed)
Noted, the order is still good.  BCBS no auth require via [employer] order sent to GI, they will reach out to the patient to schedule.

## 2021-06-05 ENCOUNTER — Other Ambulatory Visit: Payer: Self-pay

## 2021-06-05 ENCOUNTER — Ambulatory Visit
Admission: RE | Admit: 2021-06-05 | Discharge: 2021-06-05 | Disposition: A | Discharge status: Home / Self Care | Payer: BC Managed Care – PPO | Source: Ambulatory Visit | Attending: Neurology | Admitting: Neurology

## 2021-06-05 DIAGNOSIS — G40909 Epilepsy, unspecified, not intractable, without status epilepticus: Secondary | ICD-10-CM

## 2021-06-05 DIAGNOSIS — F32A Depression, unspecified: Secondary | ICD-10-CM

## 2021-06-05 MED ORDER — GADOBENATE DIMEGLUMINE 529 MG/ML IV SOLN
15.0000 mL | Freq: Once | INTRAVENOUS | Status: AC | PRN
  Administered 2021-06-05: 15 mL via INTRAVENOUS

## 2022-02-10 IMAGING — MR MR HEAD WO/W CM
13 of 14 series · 42 of 48 positions shown · IV contrast (15ml Multihance)
Comparison: Head CT 10/08/2020

CLINICAL DATA: Seizure.  Depression.

EXAM:
MRI HEAD WITHOUT AND WITH CONTRAST
TECHNIQUE: Multiplanar, multiecho pulse sequences of the brain and surrounding
structures were obtained without and with intravenous contrast.
CONTRAST:  15mL MULTIHANCE GADOBENATE DIMEGLUMINE 529 MG/ML IV SOLN

[Series 2: T1 · sagittal · 5.0mm · 0.45mm/px · 2 of 21 slices shown]
[im 1/21]
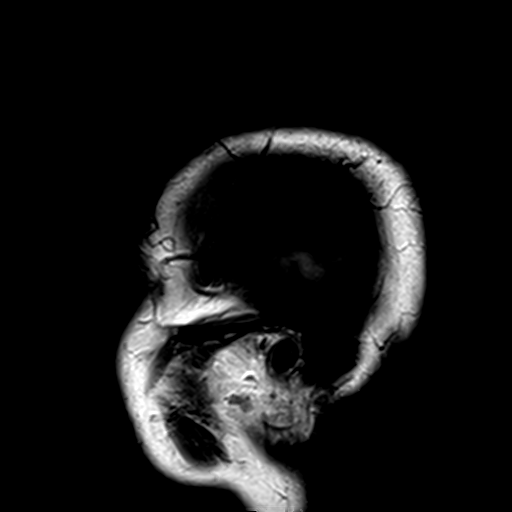
[im 21/21]
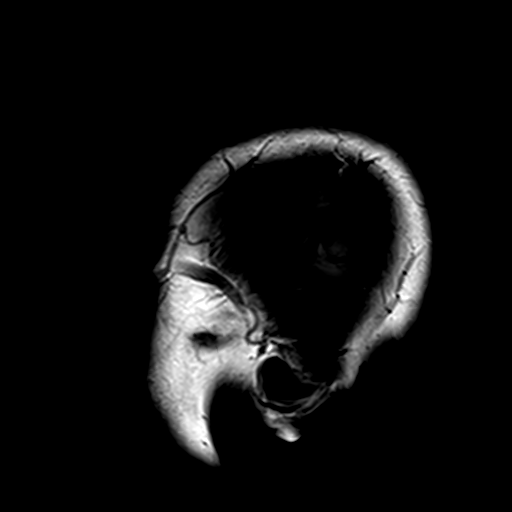

[Series 3: DWI · axial · 3.0mm · 1.80mm/px · z∈[-16,+128]mm · 6 of 100 slices shown (1 of 4)]
[im 1/100]
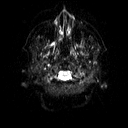
[im 20/100]
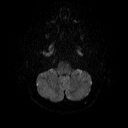
[im 40/100]
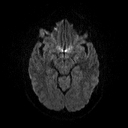
[im 60/100]
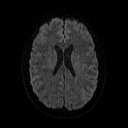
[im 80/100]
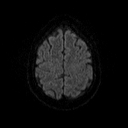
[im 100/100]
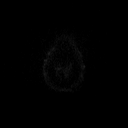

[Series 4: DWI · axial · 3.0mm · 1.80mm/px · z∈[-16,+128]mm · 3 of 46 slices shown (2 of 4)]
[im 1/46]
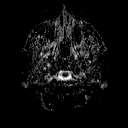
[im 23/46]
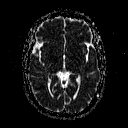
[im 46/46]
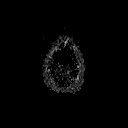

[Series 5: DWI · coronal · 5.0mm · 1.80mm/px · 4 of 68 slices shown (3 of 4)]
[im 1/68]
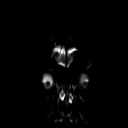
[im 23/68]
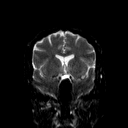
[im 45/68]
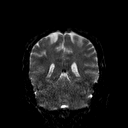
[im 68/68]
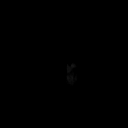

[Series 6: DWI · coronal · 5.0mm · 1.80mm/px · 2 of 34 slices shown (4 of 4)]
[im 1/34]
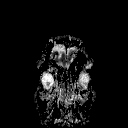
[im 34/34]
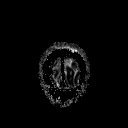

[Series 7: T2 · axial · 5.0mm · 0.60mm/px · 1 of 22 slices shown (1 of 3)]
[im 1/22]
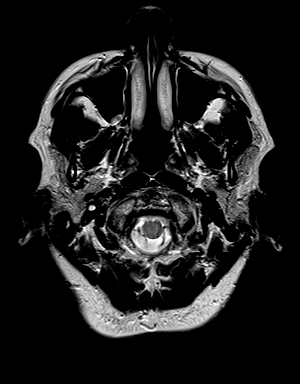

[Series 8: FLAIR · axial · 3.0mm · 0.45mm/px · z∈[-10,+122]mm · 2 of 30 slices shown (1 of 2)]
[im 1/30]
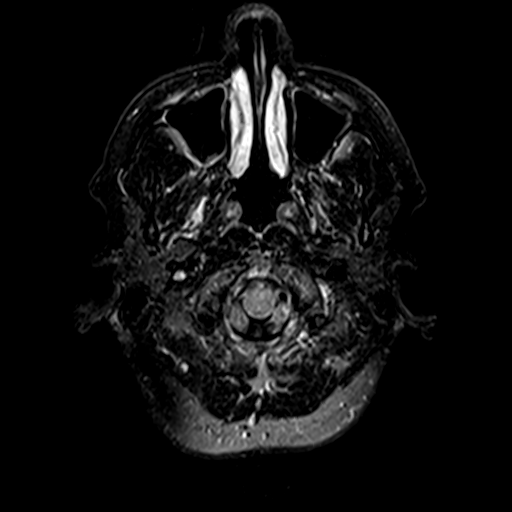
[im 30/30]
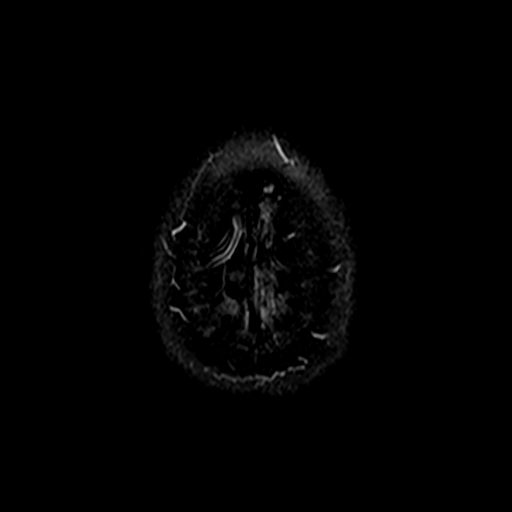

[Series 10: swi_images · axial · 4.0mm · 0.90mm/px · z∈[-12,+125]mm · 2 of 36 slices shown]
[im 1/36]
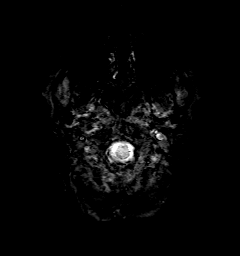
[im 36/36]
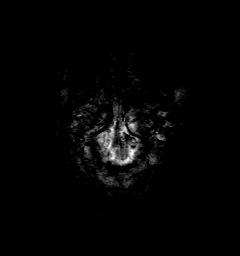

[Series 11: t1_mpr_tra · axial · 1.0mm · 0.75mm/px · z∈[-14,+127]mm · 8 of 144 slices shown]
[im 1/144]
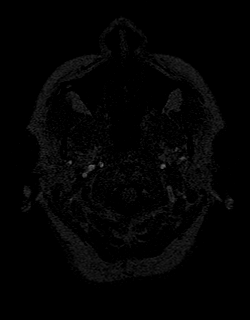
[im 18/144]
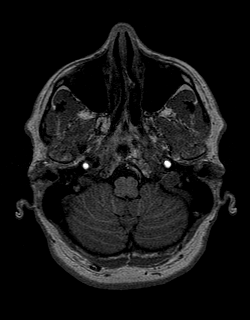
[im 36/144]
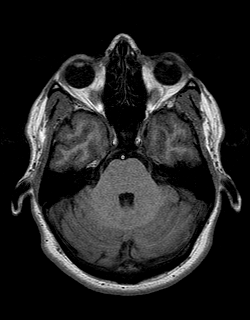
[im 54/144]
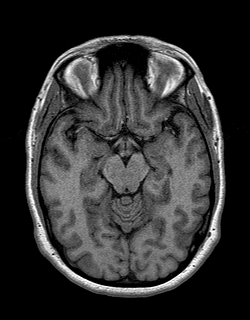
[im 90/144]
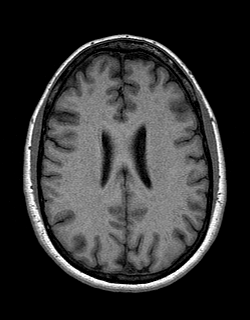
[im 108/144]
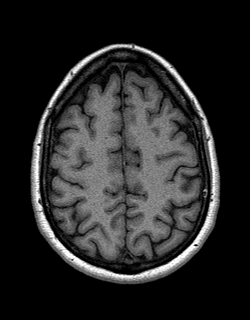
[im 126/144]
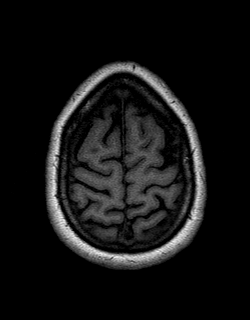
[im 144/144]
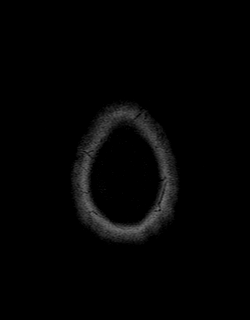

[Series 12: FLAIR · coronal · 3.0mm · 0.35mm/px · 2 of 30 slices shown (2 of 2)]
[im 1/30]
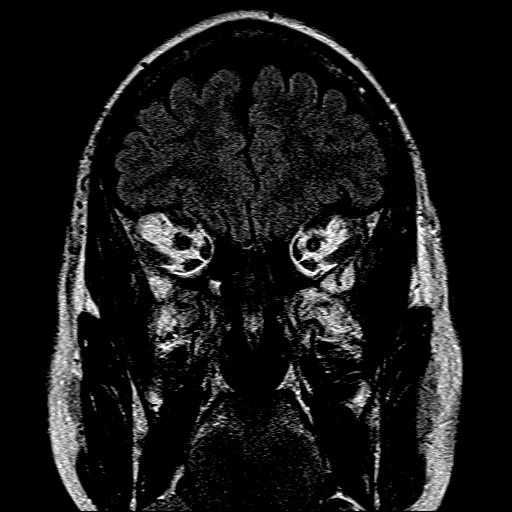
[im 30/30]
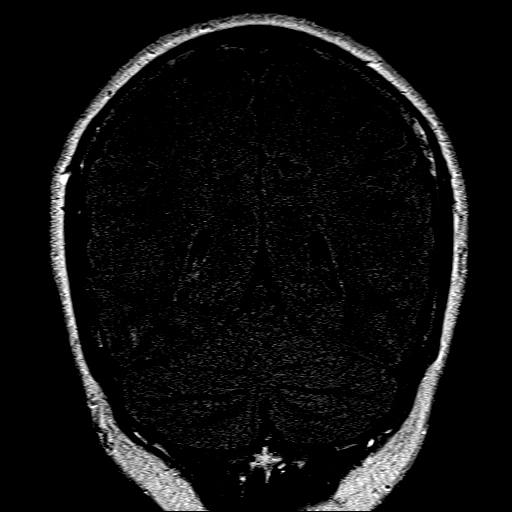

[Series 13: T2 · coronal · 3.0mm · 0.23mm/px · 2 of 30 slices shown (2 of 3)]
[im 1/30]
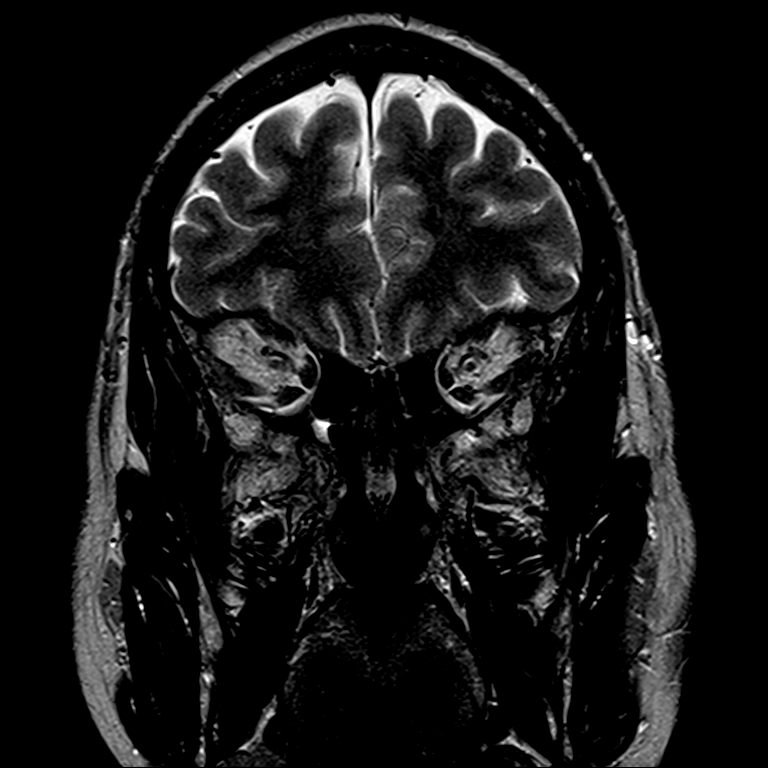
[im 30/30]
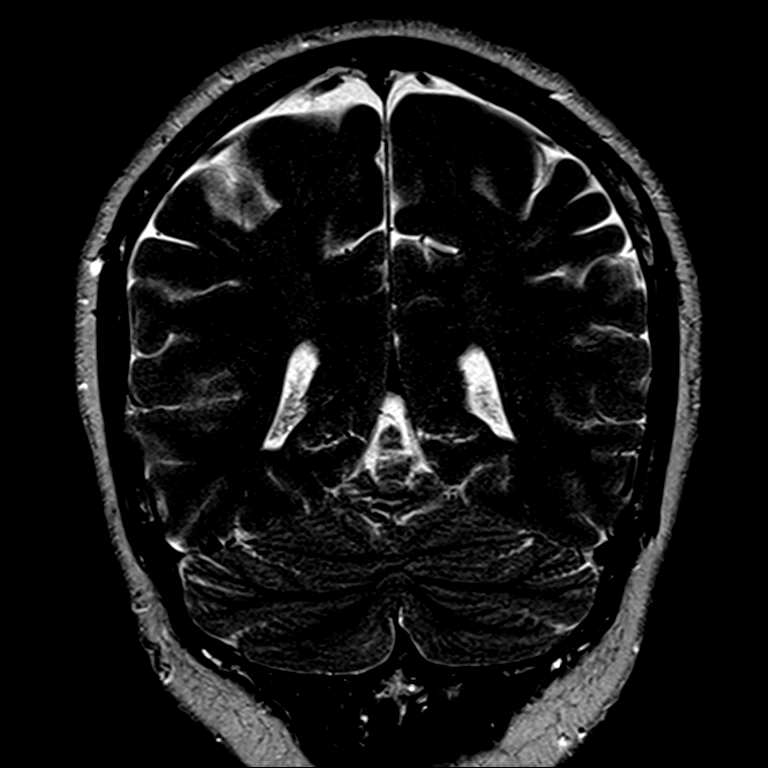

[Series 14: T2 · coronal · 5.0mm · 0.45mm/px · 2 of 27 slices shown (3 of 3)]
[im 1/27]
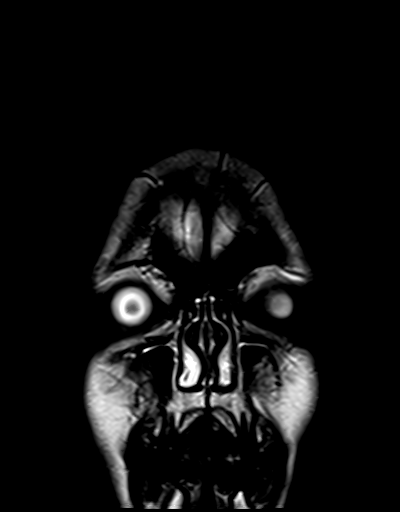
[im 27/27]
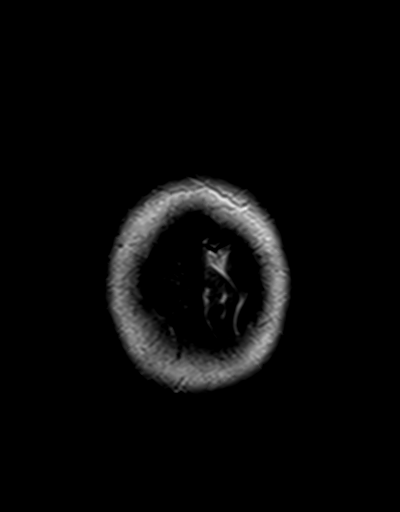

[Series 15: t1_mpr_tra post · axial · 1.0mm · 0.75mm/px · z∈[-14,+91]mm · 6 of 144 slices shown]
[im 1/144]
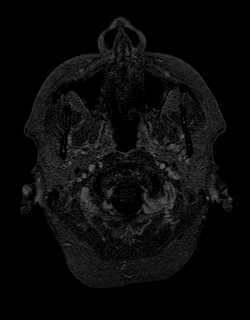
[im 18/144]
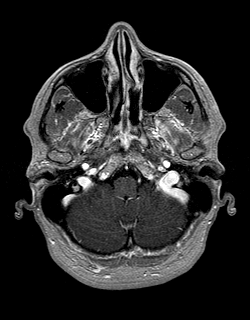
[im 36/144]
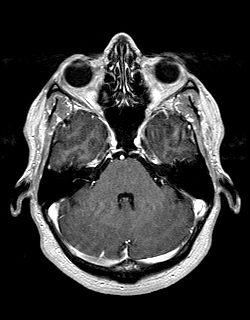
[im 54/144]
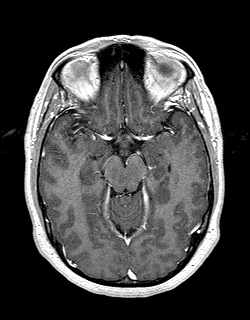
[im 90/144]
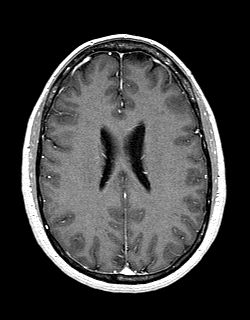
[im 108/144]
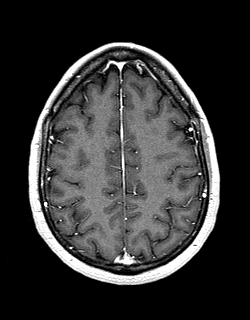

[42 of 48 positions shown; findings below may reference images not displayed]

FINDINGS: Brain: There is no evidence of an acute infarct, intracranial
hemorrhage, mass, midline shift, or extra-axial fluid collection.
There are a few small foci of T2 FLAIR hyperintensity in the white
matter of the frontal lobes. The ventricles and sulci are normal.
The cerebellar tonsils are normally positioned. No abnormal
enhancement is identified. A partially empty sella is noted.
Dedicated imaging through the temporal lobes demonstrates normal
volume and signal of the hippocampi.

Vascular: Major intracranial vascular flow voids are preserved.

Skull and upper cervical spine: Unremarkable bone marrow signal.

Sinuses/Orbits: Unremarkable orbits. Minimal mucosal thickening in
the paranasal sinuses. Clear mastoid air cells.

Other: None.
IMPRESSION: 1. No acute intracranial abnormality or etiology of seizures
identified.
2. Minimal cerebral white matter T2 signal changes, nonspecific
though may reflect early chronic small vessel ischemia, migraines,
or prior infection/inflammation.
3. Partially empty sella, often an incidental finding though can be
seen with idiopathic intracranial hypertension.

## 2022-05-18 ENCOUNTER — Ambulatory Visit: Payer: BLUE CROSS/BLUE SHIELD | Admitting: Neurology

## 2023-10-03 ENCOUNTER — Other Ambulatory Visit: Payer: Self-pay | Admitting: Family Medicine

## 2023-10-03 DIAGNOSIS — Z1231 Encounter for screening mammogram for malignant neoplasm of breast: Secondary | ICD-10-CM

## 2023-10-19 ENCOUNTER — Ambulatory Visit
